# Patient Record
Sex: Female | Born: 1945
Health system: Southern US, Community
[De-identification: ages and names within clinical notes are randomized; demographics above are authoritative.]

## PROBLEM LIST (undated history)

## (undated) DIAGNOSIS — I10 Essential (primary) hypertension: Secondary | ICD-10-CM

## (undated) DIAGNOSIS — R51 Headache: Secondary | ICD-10-CM

## (undated) DIAGNOSIS — R519 Headache, unspecified: Secondary | ICD-10-CM

## (undated) DIAGNOSIS — J45909 Unspecified asthma, uncomplicated: Secondary | ICD-10-CM

## (undated) DIAGNOSIS — H269 Unspecified cataract: Secondary | ICD-10-CM

## (undated) HISTORY — PX: BREAST SURGERY: SHX581

---

## 1999-05-12 ENCOUNTER — Other Ambulatory Visit: Admission: RE | Admit: 1999-05-12 | Discharge: 1999-05-12 | Payer: Self-pay | Admitting: Obstetrics and Gynecology

## 2000-06-05 ENCOUNTER — Other Ambulatory Visit: Admission: RE | Admit: 2000-06-05 | Discharge: 2000-06-05 | Payer: Self-pay | Admitting: Obstetrics and Gynecology

## 2001-06-17 ENCOUNTER — Other Ambulatory Visit: Admission: RE | Admit: 2001-06-17 | Discharge: 2001-06-17 | Payer: Self-pay | Admitting: Obstetrics and Gynecology

## 2001-06-18 ENCOUNTER — Encounter: Payer: Self-pay | Admitting: Obstetrics and Gynecology

## 2001-06-18 ENCOUNTER — Ambulatory Visit (HOSPITAL_COMMUNITY): Admission: RE | Admit: 2001-06-18 | Discharge: 2001-06-18 | Payer: Self-pay | Admitting: Obstetrics and Gynecology

## 2002-07-09 ENCOUNTER — Other Ambulatory Visit: Admission: RE | Admit: 2002-07-09 | Discharge: 2002-07-09 | Payer: Self-pay | Admitting: Obstetrics and Gynecology

## 2003-01-13 ENCOUNTER — Emergency Department (HOSPITAL_COMMUNITY): Admission: AD | Admit: 2003-01-13 | Discharge: 2003-01-13 | Payer: Self-pay | Admitting: Family Medicine

## 2003-08-21 ENCOUNTER — Other Ambulatory Visit: Admission: RE | Admit: 2003-08-21 | Discharge: 2003-08-21 | Payer: Self-pay | Admitting: Obstetrics and Gynecology

## 2004-02-11 ENCOUNTER — Ambulatory Visit (HOSPITAL_COMMUNITY): Admission: RE | Admit: 2004-02-11 | Discharge: 2004-02-11 | Payer: Self-pay | Admitting: Gastroenterology

## 2004-08-23 ENCOUNTER — Other Ambulatory Visit: Admission: RE | Admit: 2004-08-23 | Discharge: 2004-08-23 | Payer: Self-pay | Admitting: Obstetrics and Gynecology

## 2005-09-19 ENCOUNTER — Encounter: Admission: RE | Admit: 2005-09-19 | Discharge: 2005-09-19 | Payer: Self-pay | Admitting: Internal Medicine

## 2006-12-15 ENCOUNTER — Emergency Department (HOSPITAL_COMMUNITY): Admission: EM | Admit: 2006-12-15 | Discharge: 2006-12-15 | Payer: Self-pay | Admitting: Emergency Medicine

## 2008-03-24 ENCOUNTER — Encounter (INDEPENDENT_AMBULATORY_CARE_PROVIDER_SITE_OTHER): Payer: Self-pay | Admitting: *Deleted

## 2009-02-16 ENCOUNTER — Telehealth: Payer: Self-pay | Admitting: Internal Medicine

## 2010-02-03 NOTE — Progress Notes (Signed)
Summary: Schedule Colonoscopy  Phone Note Outgoing Call Call back at [facility] Phone [phone]   Call placed by: Harlow Mares CMA Duncan Dull),  February 16, 2009 9:04 AM Call placed to: Patient Summary of Call: No Answer patient needs colonoscopy Initial call taken by: Harlow Mares CMA Duncan Dull),  February 16, 2009 9:04 AM  Follow-up for Phone Call        No Answer  Follow-up by: Harlow Mares CMA Duncan Dull),  February 24, 2009 12:34 PM

## 2010-05-20 NOTE — Op Note (Signed)
NAME:  Sandra Walton, Sandra Walton                ACCOUNT NO.:  [id number]   MEDICAL RECORD NO.:  [id number]          PATIENT TYPE:  AMB   LOCATION:  ENDO                         FACILITY:  [facility]   PHYSICIAN:  Danise Edge, M.D.   DATE OF BIRTH:  27-Sep-1945   DATE OF PROCEDURE:  02/11/2004  DATE OF DISCHARGE:                                 OPERATIVE REPORT   REFERRING PHYSICIAN:  Dr. Candyce Churn.   PROCEDURE:  Diagnostic colonoscopy.   PROCEDURE INDICATIONS:  Ms. Sandra Walton is a 65 year old female born  [birth year]. Ms. Sandra Walton is undergoing diagnostic colonoscopy to  evaluate abdominal cramps and hematochezia.   ENDOSCOPIST:  Danise Edge, M.D.   PREMEDICATION:  Versed 5 mg, Demerol 50 mg.   PROCEDURE:  After obtaining informed consent, Ms. Sandra Walton was placed in the  left lateral decubitus position. I administered intravenous Demerol and  intravenous Versed to achieve conscious sedation for the procedure. The  patient's blood pressure, oxygen saturation and cardiac rhythm were  monitored throughout the procedure and documented in the medical record.   Anal inspection and digital rectal exam were normal. The Olympus adjustable  pediatric colonoscope was introduced into the rectum and advanced to the  cecum. Colonic preparation for the exam today was excellent.   Rectum normal.   Sigmoid colon and descending colon normal.   Splenic flexure normal.   Transverse colon normal.   Hepatic flexure normal.   Ascending colon normal.   Cecum and ileocecal valve normal.   ASSESSMENT:  Normal diagnostic proctocolonoscopy to cecum.      MJ/MEDQ  D:  02/11/2004  T:  02/11/2004  Job:  [id number]   cc:   Candyce Churn, M.D.  [address] [place]  [place] [address]  Fax: [phone]

## 2011-02-03 DIAGNOSIS — M159 Polyosteoarthritis, unspecified: Secondary | ICD-10-CM | POA: Diagnosis not present

## 2011-04-10 DIAGNOSIS — R071 Chest pain on breathing: Secondary | ICD-10-CM | POA: Diagnosis not present

## 2011-04-20 DIAGNOSIS — Z79899 Other long term (current) drug therapy: Secondary | ICD-10-CM | POA: Diagnosis not present

## 2011-04-20 DIAGNOSIS — E782 Mixed hyperlipidemia: Secondary | ICD-10-CM | POA: Diagnosis not present

## 2011-04-20 DIAGNOSIS — I10 Essential (primary) hypertension: Secondary | ICD-10-CM | POA: Diagnosis not present

## 2011-04-20 DIAGNOSIS — Z0000 Encounter for general adult medical examination without abnormal findings: Secondary | ICD-10-CM | POA: Diagnosis not present

## 2011-04-20 DIAGNOSIS — E559 Vitamin D deficiency, unspecified: Secondary | ICD-10-CM | POA: Diagnosis not present

## 2011-04-20 DIAGNOSIS — R071 Chest pain on breathing: Secondary | ICD-10-CM | POA: Diagnosis not present

## 2011-04-20 DIAGNOSIS — Z23 Encounter for immunization: Secondary | ICD-10-CM | POA: Diagnosis not present

## 2011-04-20 DIAGNOSIS — R000 Tachycardia, unspecified: Secondary | ICD-10-CM | POA: Diagnosis not present

## 2011-07-18 ENCOUNTER — Encounter: Payer: Self-pay | Admitting: Internal Medicine

## 2011-07-21 DIAGNOSIS — H60399 Other infective otitis externa, unspecified ear: Secondary | ICD-10-CM | POA: Diagnosis not present

## 2011-07-25 DIAGNOSIS — H6980 Other specified disorders of Eustachian tube, unspecified ear: Secondary | ICD-10-CM | POA: Diagnosis not present

## 2011-07-25 DIAGNOSIS — Z0000 Encounter for general adult medical examination without abnormal findings: Secondary | ICD-10-CM | POA: Diagnosis not present

## 2011-07-25 DIAGNOSIS — H6690 Otitis media, unspecified, unspecified ear: Secondary | ICD-10-CM | POA: Diagnosis not present

## 2011-07-25 DIAGNOSIS — H60399 Other infective otitis externa, unspecified ear: Secondary | ICD-10-CM | POA: Diagnosis not present

## 2011-08-15 DIAGNOSIS — H60399 Other infective otitis externa, unspecified ear: Secondary | ICD-10-CM | POA: Diagnosis not present

## 2011-08-15 DIAGNOSIS — J301 Allergic rhinitis due to pollen: Secondary | ICD-10-CM | POA: Diagnosis not present

## 2012-04-26 DIAGNOSIS — G43109 Migraine with aura, not intractable, without status migrainosus: Secondary | ICD-10-CM | POA: Diagnosis not present

## 2012-04-26 DIAGNOSIS — J309 Allergic rhinitis, unspecified: Secondary | ICD-10-CM | POA: Diagnosis not present

## 2012-04-26 DIAGNOSIS — Z0000 Encounter for general adult medical examination without abnormal findings: Secondary | ICD-10-CM | POA: Diagnosis not present

## 2012-04-26 DIAGNOSIS — I10 Essential (primary) hypertension: Secondary | ICD-10-CM | POA: Diagnosis not present

## 2012-04-26 DIAGNOSIS — Z79899 Other long term (current) drug therapy: Secondary | ICD-10-CM | POA: Diagnosis not present

## 2012-04-26 DIAGNOSIS — E782 Mixed hyperlipidemia: Secondary | ICD-10-CM | POA: Diagnosis not present

## 2012-04-26 DIAGNOSIS — E559 Vitamin D deficiency, unspecified: Secondary | ICD-10-CM | POA: Diagnosis not present

## 2013-05-05 DIAGNOSIS — J309 Allergic rhinitis, unspecified: Secondary | ICD-10-CM | POA: Diagnosis not present

## 2013-05-12 DIAGNOSIS — Z1331 Encounter for screening for depression: Secondary | ICD-10-CM | POA: Diagnosis not present

## 2013-05-12 DIAGNOSIS — Z0000 Encounter for general adult medical examination without abnormal findings: Secondary | ICD-10-CM | POA: Diagnosis not present

## 2013-05-12 DIAGNOSIS — E782 Mixed hyperlipidemia: Secondary | ICD-10-CM | POA: Diagnosis not present

## 2013-05-12 DIAGNOSIS — G43109 Migraine with aura, not intractable, without status migrainosus: Secondary | ICD-10-CM | POA: Diagnosis not present

## 2013-05-12 DIAGNOSIS — J309 Allergic rhinitis, unspecified: Secondary | ICD-10-CM | POA: Diagnosis not present

## 2013-05-12 DIAGNOSIS — E559 Vitamin D deficiency, unspecified: Secondary | ICD-10-CM | POA: Diagnosis not present

## 2013-05-12 DIAGNOSIS — Z79899 Other long term (current) drug therapy: Secondary | ICD-10-CM | POA: Diagnosis not present

## 2013-05-12 DIAGNOSIS — I10 Essential (primary) hypertension: Secondary | ICD-10-CM | POA: Diagnosis not present

## 2013-05-20 DIAGNOSIS — N289 Disorder of kidney and ureter, unspecified: Secondary | ICD-10-CM | POA: Diagnosis not present

## 2013-07-18 DIAGNOSIS — Z124 Encounter for screening for malignant neoplasm of cervix: Secondary | ICD-10-CM | POA: Diagnosis not present

## 2013-07-18 DIAGNOSIS — Z01419 Encounter for gynecological examination (general) (routine) without abnormal findings: Secondary | ICD-10-CM | POA: Diagnosis not present

## 2013-07-18 DIAGNOSIS — Z1231 Encounter for screening mammogram for malignant neoplasm of breast: Secondary | ICD-10-CM | POA: Diagnosis not present

## 2013-08-26 DIAGNOSIS — H01009 Unspecified blepharitis unspecified eye, unspecified eyelid: Secondary | ICD-10-CM | POA: Diagnosis not present

## 2013-08-26 DIAGNOSIS — H40019 Open angle with borderline findings, low risk, unspecified eye: Secondary | ICD-10-CM | POA: Diagnosis not present

## 2013-08-26 DIAGNOSIS — H25019 Cortical age-related cataract, unspecified eye: Secondary | ICD-10-CM | POA: Diagnosis not present

## 2014-02-24 DIAGNOSIS — H2513 Age-related nuclear cataract, bilateral: Secondary | ICD-10-CM | POA: Diagnosis not present

## 2014-02-24 DIAGNOSIS — H40013 Open angle with borderline findings, low risk, bilateral: Secondary | ICD-10-CM | POA: Diagnosis not present

## 2014-03-26 DIAGNOSIS — T07 Unspecified multiple injuries: Secondary | ICD-10-CM | POA: Diagnosis not present

## 2014-07-09 DIAGNOSIS — H40013 Open angle with borderline findings, low risk, bilateral: Secondary | ICD-10-CM | POA: Diagnosis not present

## 2014-07-09 DIAGNOSIS — H25013 Cortical age-related cataract, bilateral: Secondary | ICD-10-CM | POA: Diagnosis not present

## 2014-07-28 DIAGNOSIS — Z6828 Body mass index (BMI) 28.0-28.9, adult: Secondary | ICD-10-CM | POA: Diagnosis not present

## 2014-07-28 DIAGNOSIS — R319 Hematuria, unspecified: Secondary | ICD-10-CM | POA: Diagnosis not present

## 2014-07-28 DIAGNOSIS — Z130 Encounter for screening for diseases of the blood and blood-forming organs and certain disorders involving the immune mechanism: Secondary | ICD-10-CM | POA: Diagnosis not present

## 2014-07-28 DIAGNOSIS — Z01419 Encounter for gynecological examination (general) (routine) without abnormal findings: Secondary | ICD-10-CM | POA: Diagnosis not present

## 2014-07-28 DIAGNOSIS — Z1231 Encounter for screening mammogram for malignant neoplasm of breast: Secondary | ICD-10-CM | POA: Diagnosis not present

## 2014-08-04 ENCOUNTER — Ambulatory Visit
Admission: RE | Admit: 2014-08-04 | Discharge: 2014-08-04 | Disposition: A | Discharge status: Home / Self Care | Payer: Medicare Other | Source: Ambulatory Visit | Attending: Nurse Practitioner | Admitting: Nurse Practitioner

## 2014-08-04 ENCOUNTER — Other Ambulatory Visit: Payer: Self-pay | Admitting: Nurse Practitioner

## 2014-08-04 DIAGNOSIS — R0781 Pleurodynia: Secondary | ICD-10-CM | POA: Diagnosis not present

## 2014-08-04 DIAGNOSIS — S79912A Unspecified injury of left hip, initial encounter: Secondary | ICD-10-CM | POA: Diagnosis not present

## 2014-08-04 DIAGNOSIS — M5489 Other dorsalgia: Secondary | ICD-10-CM

## 2014-08-04 DIAGNOSIS — S3992XA Unspecified injury of lower back, initial encounter: Secondary | ICD-10-CM | POA: Diagnosis not present

## 2014-08-04 DIAGNOSIS — S299XXA Unspecified injury of thorax, initial encounter: Secondary | ICD-10-CM | POA: Diagnosis not present

## 2014-08-04 DIAGNOSIS — M25551 Pain in right hip: Secondary | ICD-10-CM | POA: Diagnosis not present

## 2014-08-04 DIAGNOSIS — M549 Dorsalgia, unspecified: Secondary | ICD-10-CM | POA: Diagnosis not present

## 2014-08-04 DIAGNOSIS — W19XXXA Unspecified fall, initial encounter: Secondary | ICD-10-CM | POA: Diagnosis not present

## 2014-08-04 DIAGNOSIS — M25552 Pain in left hip: Secondary | ICD-10-CM | POA: Diagnosis not present

## 2014-08-25 DIAGNOSIS — M549 Dorsalgia, unspecified: Secondary | ICD-10-CM | POA: Diagnosis not present

## 2014-08-25 DIAGNOSIS — M25551 Pain in right hip: Secondary | ICD-10-CM | POA: Diagnosis not present

## 2014-08-25 DIAGNOSIS — W19XXXA Unspecified fall, initial encounter: Secondary | ICD-10-CM | POA: Diagnosis not present

## 2014-08-25 DIAGNOSIS — R319 Hematuria, unspecified: Secondary | ICD-10-CM | POA: Diagnosis not present

## 2014-10-16 DIAGNOSIS — Z0001 Encounter for general adult medical examination with abnormal findings: Secondary | ICD-10-CM | POA: Diagnosis not present

## 2014-10-16 DIAGNOSIS — E782 Mixed hyperlipidemia: Secondary | ICD-10-CM | POA: Diagnosis not present

## 2014-10-16 DIAGNOSIS — R319 Hematuria, unspecified: Secondary | ICD-10-CM | POA: Diagnosis not present

## 2014-10-16 DIAGNOSIS — I10 Essential (primary) hypertension: Secondary | ICD-10-CM | POA: Diagnosis not present

## 2014-10-16 DIAGNOSIS — Z79899 Other long term (current) drug therapy: Secondary | ICD-10-CM | POA: Diagnosis not present

## 2014-10-16 DIAGNOSIS — Z1389 Encounter for screening for other disorder: Secondary | ICD-10-CM | POA: Diagnosis not present

## 2014-10-16 DIAGNOSIS — Z23 Encounter for immunization: Secondary | ICD-10-CM | POA: Diagnosis not present

## 2014-10-16 DIAGNOSIS — J309 Allergic rhinitis, unspecified: Secondary | ICD-10-CM | POA: Diagnosis not present

## 2014-10-16 DIAGNOSIS — E559 Vitamin D deficiency, unspecified: Secondary | ICD-10-CM | POA: Diagnosis not present

## 2014-10-16 DIAGNOSIS — K219 Gastro-esophageal reflux disease without esophagitis: Secondary | ICD-10-CM | POA: Diagnosis not present

## 2014-10-16 DIAGNOSIS — G43109 Migraine with aura, not intractable, without status migrainosus: Secondary | ICD-10-CM | POA: Diagnosis not present

## 2014-10-16 DIAGNOSIS — Z7189 Other specified counseling: Secondary | ICD-10-CM | POA: Diagnosis not present

## 2014-10-20 DIAGNOSIS — Z0001 Encounter for general adult medical examination with abnormal findings: Secondary | ICD-10-CM | POA: Diagnosis not present

## 2014-10-20 DIAGNOSIS — Z79899 Other long term (current) drug therapy: Secondary | ICD-10-CM | POA: Diagnosis not present

## 2014-11-10 DIAGNOSIS — N390 Urinary tract infection, site not specified: Secondary | ICD-10-CM | POA: Diagnosis not present

## 2015-01-07 DIAGNOSIS — J329 Chronic sinusitis, unspecified: Secondary | ICD-10-CM | POA: Diagnosis not present

## 2015-04-09 ENCOUNTER — Other Ambulatory Visit: Payer: Self-pay | Admitting: Gastroenterology

## 2015-05-11 ENCOUNTER — Encounter (HOSPITAL_COMMUNITY): Payer: Self-pay | Admitting: *Deleted

## 2015-05-17 ENCOUNTER — Encounter (HOSPITAL_COMMUNITY): Payer: Self-pay | Admitting: Anesthesiology

## 2015-05-17 NOTE — Anesthesia Preprocedure Evaluation (Addendum)
Anesthesia Evaluation  Patient identified by MRN, date of birth, ID band Patient awake    Reviewed: Allergy & Precautions, NPO status , Patient's Chart, lab work & pertinent test results  Airway Mallampati: II  TM Distance: >3 FB Neck ROM: Full    Dental no notable dental hx.    Pulmonary asthma ,    Pulmonary exam normal breath sounds clear to auscultation       Cardiovascular hypertension, Normal cardiovascular exam Rhythm:Regular Rate:Normal     Neuro/Psych  Headaches, negative psych ROS   GI/Hepatic negative GI ROS, Neg liver ROS,   Endo/Other  negative endocrine ROS  Renal/GU negative Renal ROS  negative genitourinary   Musculoskeletal negative musculoskeletal ROS (+)   Abdominal   Peds negative pediatric ROS (+)  Hematology negative hematology ROS (+)   Anesthesia Other Findings   Reproductive/Obstetrics negative OB ROS                             Anesthesia Physical Anesthesia Plan  ASA: II  Anesthesia Plan: MAC   Post-op Pain Management:    Induction: Intravenous  Airway Management Planned: Natural Airway  Additional Equipment:   Intra-op Plan:   Post-operative Plan:   Informed Consent: I have reviewed the patients History and Physical, chart, labs and discussed the procedure including the risks, benefits and alternatives for the proposed anesthesia with the patient or authorized representative who has indicated his/her understanding and acceptance.   Dental advisory given  Plan Discussed with: CRNA  Anesthesia Plan Comments:         Anesthesia Quick Evaluation

## 2015-05-18 ENCOUNTER — Encounter (HOSPITAL_COMMUNITY)
Admission: RE | Disposition: A | Discharge status: Home / Self Care | Payer: Self-pay | Source: Ambulatory Visit | Attending: Gastroenterology

## 2015-05-18 ENCOUNTER — Encounter (HOSPITAL_COMMUNITY): Payer: Self-pay

## 2015-05-18 ENCOUNTER — Ambulatory Visit (HOSPITAL_COMMUNITY): Payer: Medicare Other | Admitting: Anesthesiology

## 2015-05-18 ENCOUNTER — Ambulatory Visit (HOSPITAL_COMMUNITY)
Admission: RE | Admit: 2015-05-18 | Discharge: 2015-05-18 | Disposition: A | Discharge status: Home / Self Care | Payer: Medicare Other | Source: Ambulatory Visit | Attending: Gastroenterology | Admitting: Gastroenterology

## 2015-05-18 DIAGNOSIS — Z1211 Encounter for screening for malignant neoplasm of colon: Secondary | ICD-10-CM | POA: Insufficient documentation

## 2015-05-18 DIAGNOSIS — E7800 Pure hypercholesterolemia, unspecified: Secondary | ICD-10-CM | POA: Diagnosis not present

## 2015-05-18 DIAGNOSIS — I10 Essential (primary) hypertension: Secondary | ICD-10-CM | POA: Insufficient documentation

## 2015-05-18 HISTORY — DX: Unspecified asthma, uncomplicated: J45.909

## 2015-05-18 HISTORY — PX: COLONOSCOPY WITH PROPOFOL: SHX5780

## 2015-05-18 HISTORY — DX: Headache, unspecified: R51.9

## 2015-05-18 HISTORY — DX: Essential (primary) hypertension: I10

## 2015-05-18 HISTORY — DX: Headache: R51

## 2015-05-18 HISTORY — DX: Unspecified cataract: H26.9

## 2015-05-18 SURGERY — COLONOSCOPY WITH PROPOFOL
Anesthesia: Monitor Anesthesia Care

## 2015-05-18 MED ORDER — LACTATED RINGERS IV SOLN
INTRAVENOUS | Status: DC
  Administered 2015-05-18: 1000 mL via INTRAVENOUS

## 2015-05-18 MED ORDER — PROPOFOL 10 MG/ML IV BOLUS
INTRAVENOUS | Status: AC
  Filled 2015-05-18: qty 40

## 2015-05-18 MED ORDER — LIDOCAINE HCL (CARDIAC) 20 MG/ML IV SOLN
INTRAVENOUS | Status: DC | PRN
  Administered 2015-05-18: 100 mg via INTRAVENOUS

## 2015-05-18 MED ORDER — PROPOFOL 500 MG/50ML IV EMUL
INTRAVENOUS | Status: DC | PRN
  Administered 2015-05-18: 140 ug/kg/min via INTRAVENOUS

## 2015-05-18 MED ORDER — SODIUM CHLORIDE 0.9 % IV SOLN: INTRAVENOUS | Status: DC

## 2015-05-18 MED ORDER — PROPOFOL 10 MG/ML IV BOLUS
INTRAVENOUS | Status: DC | PRN
  Administered 2015-05-18: 20 mg via INTRAVENOUS

## 2015-05-18 MED ORDER — LIDOCAINE HCL (CARDIAC) 20 MG/ML IV SOLN
INTRAVENOUS | Status: AC
  Filled 2015-05-18: qty 5

## 2015-05-18 SURGICAL SUPPLY — 22 items

## 2015-05-18 NOTE — Discharge Instructions (Signed)

## 2015-05-18 NOTE — H&P (Signed)
  Procedure: Screening colonoscopy. 02/11/2004 normal screening colonoscopy was performed  History: The patient is a 70 year old female born 11-05-1945. She is scheduled to undergo a screening colonoscopy today.  Past medical history: Hypertension. Hypercholesterolemia. Allergic rhinitis. Eczema. Migraine headache syndrome. Breast lumpectomy.  Allergies: Lipitor caused arthralgias. Bee venom  Exam: The patient is alert and lying comfortably on the endoscopy stretcher. Abdomen is soft and nontender to palpation. Lungs are clear to auscultation. Cardiac exam reveals a regular rhythm.  Plan: Proceed with screening colonoscopy

## 2015-05-18 NOTE — Anesthesia Postprocedure Evaluation (Signed)
Anesthesia Post Note  Patient: Sandra Walton  Procedure(s) Performed: Procedure(s) (LRB): COLONOSCOPY WITH PROPOFOL (N/A)  Patient location during evaluation: PACU Anesthesia Type: MAC Level of consciousness: awake and alert Pain management: pain level controlled Vital Signs Assessment: post-procedure vital signs reviewed and stable Respiratory status: spontaneous breathing, nonlabored ventilation, respiratory function stable and patient connected to nasal cannula oxygen Cardiovascular status: stable and blood pressure returned to baseline Anesthetic complications: no    Last Vitals:  Filed Vitals:   05/18/15 0820 05/18/15 0830  BP: 148/75 131/65  Pulse: 73 72  Temp:    Resp: 14 14    Last Pain: There were no vitals filed for this visit.               Ashe Gago J

## 2015-05-18 NOTE — Transfer of Care (Signed)
Immediate Anesthesia Transfer of Care Note  Patient: Sandra Walton  Procedure(s) Performed: Procedure(s): COLONOSCOPY WITH PROPOFOL (N/A)  Patient Location: Endoscopy Unit  Anesthesia Type:MAC  Level of Consciousness: awake and alert   Airway & Oxygen Therapy: Patient Spontanous Breathing and Patient connected to face mask oxygen  Post-op Assessment: Report given to RN and Post -op Vital signs reviewed and stable  Post vital signs: Reviewed and stable  Last Vitals:  Filed Vitals:   05/18/15 0638  BP: 129/59  Pulse: 73  Temp: 36.4 C  Resp: 15    Last Pain: There were no vitals filed for this visit.       Complications: No apparent anesthesia complications

## 2015-05-18 NOTE — Op Note (Signed)
[facility] Patient Name: Sandra Walton Procedure Date: 05/18/2015 MRN: [id number] Attending MD: Garlan Fair , MD Date of Birth: August 28, 1945 CSN: [id number] Age: 70 Admit Type: Outpatient Procedure:                Colonoscopy Indications:              Screening for colorectal malignant neoplasm Providers:                Garlan Fair, MD, William Dalton, Technician,                            Hilma Favors, RN, Zenon Mayo, RN Referring MD:              Medicines:                Propofol per Anesthesia Complications:            No immediate complications. Estimated Blood Loss:     Estimated blood loss: none. Procedure:                Pre-Anesthesia Assessment:                           - Prior to the procedure, a History and Physical                            was performed, and patient medications and                            allergies were reviewed. The patient's tolerance of                            previous anesthesia was also reviewed. The risks                            and benefits of the procedure and the sedation                            options and risks were discussed with the patient.                            All questions were answered, and informed consent                            was obtained. Prior Anticoagulants: The patient has                            taken aspirin, last dose was 1 day prior to                            procedure. ASA Grade Assessment: II - A patient                            with mild systemic disease. After reviewing the  risks and benefits, the patient was deemed in                            satisfactory condition to undergo the procedure.                           After obtaining informed consent, the colonoscope                            was passed under direct vision. Throughout the                            procedure, the patient's blood pressure, pulse, and              oxygen saturations were monitored continuously. The                            [id number] scope was introduced through                            the anus and advanced to the the cecum, identified                            by appendiceal orifice and ileocecal valve. The                            colonoscopy was performed without difficulty. The                            patient tolerated the procedure well. The quality                            of the bowel preparation was good. The appendiceal                            orifice and the rectum were photographed. Scope In: 7:46:10 AM Scope Out: 8:01:51 AM Scope Withdrawal Time: 0 hours 7 minutes 51 seconds  Total Procedure Duration: 0 hours 15 minutes 41 seconds  Findings:      The perianal and digital rectal examinations were normal.      The entire examined colon appeared normal. Impression:               - The entire examined colon is normal.                           - No specimens collected. Moderate Sedation:      N/A- Per Anesthesia Care Recommendation:           - Patient has a contact number available for                            emergencies. The signs and symptoms of potential                            delayed complications were discussed with the  patient. Return to normal activities tomorrow.                            Written discharge instructions were provided to the                            patient.                           - Repeat colonoscopy is not recommended for                            screening purposes.                           - Resume previous diet.                           - Continue present medications. Procedure Code(s):        --- Professional ---                           [id number], Colonoscopy, flexible; diagnostic, including                            collection of specimen(s) by brushing or washing,                            when performed  (separate procedure) Diagnosis Code(s):        --- Professional ---                           Z12.11, Encounter for screening for malignant                            neoplasm of colon CPT copyright 2016 American Medical Association. All rights reserved. The codes documented in this report are preliminary and upon coder review may  be revised to meet current compliance requirements. Earle Gell, MD Garlan Fair, MD 05/18/2015 8:05:53 AM This report has been signed electronically. Number of Addenda: 0

## 2015-05-19 ENCOUNTER — Encounter (HOSPITAL_COMMUNITY): Payer: Self-pay | Admitting: Gastroenterology

## 2015-09-02 DIAGNOSIS — J3489 Other specified disorders of nose and nasal sinuses: Secondary | ICD-10-CM | POA: Diagnosis not present

## 2015-10-19 DIAGNOSIS — Z1389 Encounter for screening for other disorder: Secondary | ICD-10-CM | POA: Diagnosis not present

## 2015-10-19 DIAGNOSIS — E782 Mixed hyperlipidemia: Secondary | ICD-10-CM | POA: Diagnosis not present

## 2015-10-19 DIAGNOSIS — Z23 Encounter for immunization: Secondary | ICD-10-CM | POA: Diagnosis not present

## 2015-10-19 DIAGNOSIS — G43109 Migraine with aura, not intractable, without status migrainosus: Secondary | ICD-10-CM | POA: Diagnosis not present

## 2015-10-19 DIAGNOSIS — Z79899 Other long term (current) drug therapy: Secondary | ICD-10-CM | POA: Diagnosis not present

## 2015-10-19 DIAGNOSIS — K219 Gastro-esophageal reflux disease without esophagitis: Secondary | ICD-10-CM | POA: Diagnosis not present

## 2015-10-19 DIAGNOSIS — E559 Vitamin D deficiency, unspecified: Secondary | ICD-10-CM | POA: Diagnosis not present

## 2015-10-19 DIAGNOSIS — R000 Tachycardia, unspecified: Secondary | ICD-10-CM | POA: Diagnosis not present

## 2015-10-19 DIAGNOSIS — Z0001 Encounter for general adult medical examination with abnormal findings: Secondary | ICD-10-CM | POA: Diagnosis not present

## 2015-10-19 DIAGNOSIS — I10 Essential (primary) hypertension: Secondary | ICD-10-CM | POA: Diagnosis not present

## 2016-02-14 DIAGNOSIS — L245 Irritant contact dermatitis due to other chemical products: Secondary | ICD-10-CM | POA: Diagnosis not present

## 2016-05-25 DIAGNOSIS — J301 Allergic rhinitis due to pollen: Secondary | ICD-10-CM | POA: Diagnosis not present

## 2016-06-08 DIAGNOSIS — J019 Acute sinusitis, unspecified: Secondary | ICD-10-CM | POA: Diagnosis not present

## 2016-06-11 ENCOUNTER — Ambulatory Visit (HOSPITAL_COMMUNITY)
Admission: EM | Admit: 2016-06-11 | Discharge: 2016-06-11 | Disposition: A | Discharge status: Home / Self Care | Payer: Medicare Other | Attending: Internal Medicine | Admitting: Internal Medicine

## 2016-06-11 ENCOUNTER — Encounter (HOSPITAL_COMMUNITY): Payer: Self-pay | Admitting: *Deleted

## 2016-06-11 DIAGNOSIS — R197 Diarrhea, unspecified: Secondary | ICD-10-CM | POA: Diagnosis not present

## 2016-06-11 DIAGNOSIS — J321 Chronic frontal sinusitis: Secondary | ICD-10-CM

## 2016-06-11 MED ORDER — AZITHROMYCIN 250 MG PO TABS: 250.0000 mg | ORAL_TABLET | Freq: Every day | ORAL | 0 refills | Status: DC

## 2016-06-11 MED ORDER — PREDNISONE 10 MG (21) PO TBPK: ORAL_TABLET | Freq: Every day | ORAL | 0 refills | Status: DC

## 2016-06-11 NOTE — Discharge Instructions (Signed)
Use humidifier at night to help Take motrin or tylenol as needed Eat prior to taking abx Take the full dose of abx Continue taking Flonase

## 2016-06-11 NOTE — ED Triage Notes (Signed)
Pt   Reports      Cough   And  Congestion            X   sev x  3   Weeks  Pt  Was   Seen  By  Her  Family  Dr  3 days       Ago   For  Sinus       Infection      And     Was  rx  Anti  Biotics    That  Gave her   Diarrhea       And   She  Stopped  Taking      Them

## 2016-06-11 NOTE — ED Provider Notes (Signed)
CSN: [id number]     Arrival date & time 06/11/16  1303 History   First MD Initiated Contact with Patient 06/11/16 1450     Chief Complaint  Patient presents with  . Cough   (Consider location/radiation/quality/duration/timing/severity/associated sxs/prior Treatment) Pt has had congestion, runny nose, for 10 days. Seen her pcp was places on Flonase and amoxicillin with no relief and states that it cause loose stools x3 for 2 days and she stopped. Pt states that it is getting worse and causing pressure. Has not taken anything pta. Denies and abd pain, no n/v       Past Medical History:  Diagnosis Date  . Asthma    Asthma -child  . Cataracts, bilateral    not surgical yet  . Headache    past hx migraines  . Hypertension    Past Surgical History:  Procedure Laterality Date  . BREAST SURGERY Right    lumpectomy- benign  . COLONOSCOPY WITH PROPOFOL N/A 05/18/2015   Procedure: COLONOSCOPY WITH PROPOFOL;  Surgeon: Garlan Fair, MD;  Location: [facility];  Service: Endoscopy;  Laterality: N/A;   History reviewed. No pertinent family history. Social History  Substance Use Topics  . Smoking status: Never Smoker  . Smokeless tobacco: Never Used  . Alcohol use Yes     Comment: rare social   OB History    No data available     Review of Systems  Constitutional: Positive for appetite change.  HENT: Positive for congestion, ear pain, postnasal drip, rhinorrhea, sinus pain, sinus pressure and sneezing.   Eyes: Negative.   Respiratory: Negative.   Cardiovascular: Negative.   Gastrointestinal: Positive for diarrhea and nausea.  Genitourinary: Negative.   Skin: Negative.   Neurological: Negative.     Allergies  Bee venom  Home Medications   Prior to Admission medications   Medication Sig Start Date End Date Taking? Authorizing Provider  aspirin 81 MG chewable tablet Chew 81 mg by mouth daily.    [provider]  azithromycin (ZITHROMAX) 250 MG tablet Take 1  tablet (250 mg total) by mouth daily. Take first 2 tablets together, then 1 every day until finished. 06/11/16   Morley Kos A, NP  nebivolol (BYSTOLIC) 10 MG tablet Take 5 mg by mouth daily.    [provider]  predniSONE (STERAPRED UNI-PAK 21 TAB) 10 MG (21) TBPK tablet Take by mouth daily. Take 6 tabs by mouth daily  for 2 days, then 5 tabs for 2 days, then 4 tabs for 2 days, then 3 tabs for 2 days, 2 tabs for 2 days, then 1 tab by mouth daily for 2 days 06/11/16   Morley Kos A, NP  simvastatin (ZOCOR) 20 MG tablet Take 20 mg by mouth daily.    [provider]  valsartan (DIOVAN) 40 MG tablet Take 20 mg by mouth daily.    [provider]   Meds Ordered and Administered this Visit  Medications - No data to display  BP 122/70 (BP Location: Left Arm)   Pulse 82   Temp 98.7 F (37.1 C) (Oral)   Resp 16   SpO2 97%  No data found.   Physical Exam  Constitutional: She appears well-developed.  HENT:  Right Ear: External ear normal.  Left Ear: External ear normal.  Green thick post nasal drip, tenderness to rt side of the face in nasal area,   Eyes: Pupils are equal, round, and reactive to light.  Neck: Normal range of motion.  Cardiovascular:  Normal rate and regular rhythm.   Pulmonary/Chest: Effort normal and breath sounds normal.  Abdominal: Soft. Bowel sounds are normal.  Musculoskeletal: Normal range of motion.  Neurological: She is alert.  Skin: Skin is warm.    Urgent Care Course     Procedures (including critical care time)  Labs Review Labs Reviewed - No data to display  Imaging Review No results found.           MDM   1. Chronic frontal sinusitis   2. Diarrhea, unspecified type    Use humidifier at night to help Take motrin or tylenol as needed Eat prior to taking abx Take the full dose of abx Continue taking Charlyne Petrin, NP 06/11/16 1514

## 2016-06-15 DIAGNOSIS — J309 Allergic rhinitis, unspecified: Secondary | ICD-10-CM | POA: Diagnosis not present

## 2016-10-24 DIAGNOSIS — Z23 Encounter for immunization: Secondary | ICD-10-CM | POA: Diagnosis not present

## 2016-10-24 DIAGNOSIS — K219 Gastro-esophageal reflux disease without esophagitis: Secondary | ICD-10-CM | POA: Diagnosis not present

## 2016-10-24 DIAGNOSIS — E559 Vitamin D deficiency, unspecified: Secondary | ICD-10-CM | POA: Diagnosis not present

## 2016-10-24 DIAGNOSIS — E782 Mixed hyperlipidemia: Secondary | ICD-10-CM | POA: Diagnosis not present

## 2016-10-24 DIAGNOSIS — I10 Essential (primary) hypertension: Secondary | ICD-10-CM | POA: Diagnosis not present

## 2016-10-24 DIAGNOSIS — Z1389 Encounter for screening for other disorder: Secondary | ICD-10-CM | POA: Diagnosis not present

## 2016-10-24 DIAGNOSIS — Z79899 Other long term (current) drug therapy: Secondary | ICD-10-CM | POA: Diagnosis not present

## 2016-10-24 DIAGNOSIS — J019 Acute sinusitis, unspecified: Secondary | ICD-10-CM | POA: Diagnosis not present

## 2016-10-24 DIAGNOSIS — G43109 Migraine with aura, not intractable, without status migrainosus: Secondary | ICD-10-CM | POA: Diagnosis not present

## 2016-10-24 DIAGNOSIS — Z0001 Encounter for general adult medical examination with abnormal findings: Secondary | ICD-10-CM | POA: Diagnosis not present

## 2016-10-24 DIAGNOSIS — J309 Allergic rhinitis, unspecified: Secondary | ICD-10-CM | POA: Diagnosis not present

## 2016-10-28 DIAGNOSIS — E782 Mixed hyperlipidemia: Secondary | ICD-10-CM | POA: Diagnosis not present

## 2016-10-28 DIAGNOSIS — I10 Essential (primary) hypertension: Secondary | ICD-10-CM | POA: Diagnosis not present

## 2017-01-30 DIAGNOSIS — M109 Gout, unspecified: Secondary | ICD-10-CM | POA: Diagnosis not present

## 2017-02-06 DIAGNOSIS — L720 Epidermal cyst: Secondary | ICD-10-CM | POA: Diagnosis not present

## 2017-02-06 DIAGNOSIS — L723 Sebaceous cyst: Secondary | ICD-10-CM | POA: Diagnosis not present

## 2017-04-24 DIAGNOSIS — H66003 Acute suppurative otitis media without spontaneous rupture of ear drum, bilateral: Secondary | ICD-10-CM | POA: Diagnosis not present

## 2017-08-31 DIAGNOSIS — J309 Allergic rhinitis, unspecified: Secondary | ICD-10-CM | POA: Diagnosis not present

## 2017-12-11 DIAGNOSIS — E782 Mixed hyperlipidemia: Secondary | ICD-10-CM | POA: Diagnosis not present

## 2017-12-11 DIAGNOSIS — Z0001 Encounter for general adult medical examination with abnormal findings: Secondary | ICD-10-CM | POA: Diagnosis not present

## 2017-12-11 DIAGNOSIS — Z23 Encounter for immunization: Secondary | ICD-10-CM | POA: Diagnosis not present

## 2017-12-11 DIAGNOSIS — Z79899 Other long term (current) drug therapy: Secondary | ICD-10-CM | POA: Diagnosis not present

## 2017-12-11 DIAGNOSIS — R319 Hematuria, unspecified: Secondary | ICD-10-CM | POA: Diagnosis not present

## 2017-12-11 DIAGNOSIS — G43109 Migraine with aura, not intractable, without status migrainosus: Secondary | ICD-10-CM | POA: Diagnosis not present

## 2017-12-11 DIAGNOSIS — K219 Gastro-esophageal reflux disease without esophagitis: Secondary | ICD-10-CM | POA: Diagnosis not present

## 2017-12-11 DIAGNOSIS — Z1389 Encounter for screening for other disorder: Secondary | ICD-10-CM | POA: Diagnosis not present

## 2017-12-11 DIAGNOSIS — M109 Gout, unspecified: Secondary | ICD-10-CM | POA: Diagnosis not present

## 2017-12-11 DIAGNOSIS — E559 Vitamin D deficiency, unspecified: Secondary | ICD-10-CM | POA: Diagnosis not present

## 2017-12-11 DIAGNOSIS — I10 Essential (primary) hypertension: Secondary | ICD-10-CM | POA: Diagnosis not present

## 2017-12-13 DIAGNOSIS — B029 Zoster without complications: Secondary | ICD-10-CM | POA: Diagnosis not present

## 2018-03-26 DIAGNOSIS — B349 Viral infection, unspecified: Secondary | ICD-10-CM | POA: Diagnosis not present

## 2018-03-26 DIAGNOSIS — R55 Syncope and collapse: Secondary | ICD-10-CM | POA: Diagnosis not present

## 2018-10-24 DIAGNOSIS — Z23 Encounter for immunization: Secondary | ICD-10-CM | POA: Diagnosis not present

## 2019-08-29 ENCOUNTER — Other Ambulatory Visit: Payer: Self-pay

## 2019-08-29 DIAGNOSIS — Z20822 Contact with and (suspected) exposure to covid-19: Secondary | ICD-10-CM

## 2019-08-31 LAB — NOVEL CORONAVIRUS, NAA: SARS-CoV-2, NAA: NOT DETECTED

## 2019-08-31 LAB — SARS-COV-2, NAA 2 DAY TAT

## 2019-10-14 ENCOUNTER — Ambulatory Visit: Payer: Medicare Other | Attending: Critical Care Medicine

## 2019-10-14 DIAGNOSIS — Z23 Encounter for immunization: Secondary | ICD-10-CM

## 2019-10-14 NOTE — Progress Notes (Signed)
° °  Covid-19 Vaccination Clinic  Name:  Sandra Walton    MRN: [id number] DOB: Sep 15, 1945  10/14/2019  [name] was observed post Covid-19 immunization for 15 minutes without incident. She was provided with Vaccine Information Sheet and instruction to access the V-Safe system.   [name] was instructed to call 911 with any severe reactions post vaccine:  Difficulty breathing   Swelling of face and throat   A fast heartbeat   A bad rash all over body   Dizziness and weakness

## 2020-01-01 ENCOUNTER — Other Ambulatory Visit: Payer: Self-pay | Admitting: Internal Medicine

## 2020-01-01 DIAGNOSIS — Z1231 Encounter for screening mammogram for malignant neoplasm of breast: Secondary | ICD-10-CM

## 2020-01-01 DIAGNOSIS — Z13820 Encounter for screening for osteoporosis: Secondary | ICD-10-CM

## 2020-04-08 DIAGNOSIS — H40013 Open angle with borderline findings, low risk, bilateral: Secondary | ICD-10-CM | POA: Diagnosis not present

## 2020-04-19 ENCOUNTER — Ambulatory Visit: Payer: Medicare Other

## 2020-04-19 ENCOUNTER — Other Ambulatory Visit: Payer: Medicare Other

## 2020-04-20 ENCOUNTER — Inpatient Hospital Stay: Admission: RE | Admit: 2020-04-20 | Payer: Medicare Other | Source: Ambulatory Visit

## 2020-04-20 ENCOUNTER — Other Ambulatory Visit: Payer: Medicare Other

## 2020-05-10 ENCOUNTER — Ambulatory Visit (HOSPITAL_COMMUNITY)
Admission: EM | Admit: 2020-05-10 | Discharge: 2020-05-10 | Disposition: A | Discharge status: Home / Self Care | Payer: Medicare Other | Attending: Internal Medicine | Admitting: Internal Medicine

## 2020-05-10 ENCOUNTER — Encounter (HOSPITAL_COMMUNITY): Payer: Self-pay

## 2020-05-10 ENCOUNTER — Other Ambulatory Visit: Payer: Self-pay

## 2020-05-10 DIAGNOSIS — Z20822 Contact with and (suspected) exposure to covid-19: Secondary | ICD-10-CM

## 2020-05-10 NOTE — ED Triage Notes (Signed)
Pt needs covid test due to friend's great granddaughter tested positive for covid today. Pt denies any symptoms.

## 2020-05-10 NOTE — Discharge Instructions (Addendum)
Please monitor for symptoms If you develop symptoms -please come to the urgent care to be re-evaluated if the symptoms are severe.

## 2020-05-11 LAB — SARS CORONAVIRUS 2 (TAT 6-24 HRS): SARS Coronavirus 2: NEGATIVE

## 2020-05-12 NOTE — ED Provider Notes (Signed)
[facility]    CSN: [id number] Arrival date & time: 05/10/20  1715      History   Chief Complaint Chief Complaint  Patient presents with  . Covid Exposure    HPI Sandra Walton is a 75 y.o. female comes to the urgent care for COVID testing after she was exposed to a COVID-19 positive individual.  Patient has no symptoms.  Exposure was yesterday.  Patient is fully vaccinated and boosted against COVID-19 virus.Marland Kitchen   HPI  Past Medical History:  Diagnosis Date  . Asthma    Asthma -child  . Cataracts, bilateral    not surgical yet  . Headache    past hx migraines  . Hypertension     There are no problems to display for this patient.   Past Surgical History:  Procedure Laterality Date  . BREAST SURGERY Right    lumpectomy- benign  . COLONOSCOPY WITH PROPOFOL N/A 05/18/2015   Procedure: COLONOSCOPY WITH PROPOFOL;  Surgeon: Garlan Fair, MD;  Location: [facility];  Service: Endoscopy;  Laterality: N/A;    OB History   No obstetric history on file.      Home Medications    Prior to Admission medications   Medication Sig Start Date End Date Taking? Authorizing Provider  aspirin 81 MG chewable tablet Chew 81 mg by mouth daily.   Yes [provider]  nebivolol (BYSTOLIC) 10 MG tablet Take 5 mg by mouth daily.   Yes [provider]  simvastatin (ZOCOR) 20 MG tablet Take 20 mg by mouth daily.   Yes [provider]  valsartan (DIOVAN) 40 MG tablet Take 20 mg by mouth daily.   Yes [provider]    Family History Family History  Problem Relation Age of Onset  . Cancer Sister   . Heart disease Sister   . Cancer Brother     Social History Social History   Tobacco Use  . Smoking status: Never Smoker  . Smokeless tobacco: Never Used  Substance Use Topics  . Alcohol use: Yes    Comment: rare social  . Drug use: No     Allergies   Bee venom and Peanut-containing drug products   Review of Systems Review of  Systems  All other systems reviewed and are negative.    Physical Exam Triage Vital Signs ED Triage Vitals  Enc Vitals Group     BP 05/10/20 1843 (!) 144/89     Pulse Rate 05/10/20 1843 85     Resp 05/10/20 1843 18     Temp 05/10/20 1843 98.1 F (36.7 C)     Temp src --      SpO2 05/10/20 1843 98 %     Weight --      Height --      Head Circumference --      Peak Flow --      Pain Score 05/10/20 1840 0     Pain Loc --      Pain Edu? --      Excl. in [place]? --    No data found.  Updated Vital Signs BP (!) 144/89   Pulse 85   Temp 98.1 F (36.7 C)   Resp 18   SpO2 98%   Visual Acuity Right Eye Distance:   Left Eye Distance:   Bilateral Distance:    Right Eye Near:   Left Eye Near:    Bilateral Near:     Physical Exam Constitutional:  General: She is not in acute distress.    Appearance: Normal appearance. She is not diaphoretic.  Cardiovascular:     Rate and Rhythm: Normal rate and regular rhythm.  Pulmonary:     Effort: Pulmonary effort is normal.     Breath sounds: Normal breath sounds.  Musculoskeletal:        General: No swelling or signs of injury. Normal range of motion.  Skin:    General: Skin is warm.  Neurological:     Mental Status: She is alert.      UC Treatments / Results  Labs (all labs ordered are listed, but only abnormal results are displayed) Labs Reviewed  SARS CORONAVIRUS 2 (TAT 6-24 HRS)    EKG   Radiology No results found.  Procedures Procedures (including critical care time)  Medications Ordered in UC Medications - No data to display  Initial Impression / Assessment and Plan / UC Course  I have reviewed the triage vital signs and the nursing notes.  Pertinent labs & imaging results that were available during my care of the patient were reviewed by me and considered in my medical decision making (see chart for details).    1.  Close exposure to COVID-19 virus: COVID-19 PCR test has been sent Return to  urgent care if you develop symptoms We will call you with recommendations if your labs are abnormal. Please quarantine until COVID-19 test results are available. Final Clinical Impressions(s) / UC Diagnoses   Final diagnoses:  Close exposure to COVID-19 virus     Discharge Instructions     Please monitor for symptoms If you develop symptoms -please come to the urgent care to be re-evaluated if the symptoms are severe.   ED Prescriptions    None     PDMP not reviewed this encounter.   Chase Picket, MD 05/12/20 1659

## 2020-06-23 ENCOUNTER — Ambulatory Visit: Payer: Medicare Other

## 2020-06-29 ENCOUNTER — Ambulatory Visit
Admission: RE | Admit: 2020-06-29 | Discharge: 2020-06-29 | Disposition: A | Discharge status: Home / Self Care | Payer: Medicare Other | Source: Ambulatory Visit | Attending: Internal Medicine | Admitting: Internal Medicine

## 2020-06-29 ENCOUNTER — Other Ambulatory Visit: Payer: Self-pay

## 2020-06-29 DIAGNOSIS — Z1231 Encounter for screening mammogram for malignant neoplasm of breast: Secondary | ICD-10-CM

## 2020-07-02 ENCOUNTER — Other Ambulatory Visit: Payer: Self-pay | Admitting: Internal Medicine

## 2020-07-02 DIAGNOSIS — R928 Other abnormal and inconclusive findings on diagnostic imaging of breast: Secondary | ICD-10-CM

## 2020-07-28 ENCOUNTER — Ambulatory Visit
Admission: RE | Admit: 2020-07-28 | Discharge: 2020-07-28 | Disposition: A | Discharge status: Home / Self Care | Payer: Medicare Other | Source: Ambulatory Visit | Attending: Internal Medicine | Admitting: Internal Medicine

## 2020-07-28 ENCOUNTER — Other Ambulatory Visit: Payer: Self-pay | Admitting: Internal Medicine

## 2020-07-28 ENCOUNTER — Other Ambulatory Visit: Payer: Self-pay

## 2020-07-28 DIAGNOSIS — R921 Mammographic calcification found on diagnostic imaging of breast: Secondary | ICD-10-CM | POA: Diagnosis not present

## 2020-07-28 DIAGNOSIS — R928 Other abnormal and inconclusive findings on diagnostic imaging of breast: Secondary | ICD-10-CM

## 2020-07-28 DIAGNOSIS — R922 Inconclusive mammogram: Secondary | ICD-10-CM | POA: Diagnosis not present

## 2020-08-09 ENCOUNTER — Ambulatory Visit
Admission: RE | Admit: 2020-08-09 | Discharge: 2020-08-09 | Disposition: A | Discharge status: Home / Self Care | Payer: Medicare Other | Source: Ambulatory Visit | Attending: Internal Medicine | Admitting: Internal Medicine

## 2020-08-09 ENCOUNTER — Other Ambulatory Visit: Payer: Self-pay

## 2020-08-09 DIAGNOSIS — R928 Other abnormal and inconclusive findings on diagnostic imaging of breast: Secondary | ICD-10-CM

## 2020-08-09 DIAGNOSIS — R921 Mammographic calcification found on diagnostic imaging of breast: Secondary | ICD-10-CM | POA: Diagnosis not present

## 2020-08-09 DIAGNOSIS — N6489 Other specified disorders of breast: Secondary | ICD-10-CM | POA: Diagnosis not present

## 2020-11-01 DIAGNOSIS — Z23 Encounter for immunization: Secondary | ICD-10-CM | POA: Diagnosis not present

## 2020-11-01 DIAGNOSIS — M549 Dorsalgia, unspecified: Secondary | ICD-10-CM | POA: Diagnosis not present

## 2021-01-25 DIAGNOSIS — E782 Mixed hyperlipidemia: Secondary | ICD-10-CM | POA: Diagnosis not present

## 2021-01-25 DIAGNOSIS — E559 Vitamin D deficiency, unspecified: Secondary | ICD-10-CM | POA: Diagnosis not present

## 2021-01-25 DIAGNOSIS — I10 Essential (primary) hypertension: Secondary | ICD-10-CM | POA: Diagnosis not present

## 2021-01-25 DIAGNOSIS — Z1389 Encounter for screening for other disorder: Secondary | ICD-10-CM | POA: Diagnosis not present

## 2021-01-25 DIAGNOSIS — Z1239 Encounter for other screening for malignant neoplasm of breast: Secondary | ICD-10-CM | POA: Diagnosis not present

## 2021-01-25 DIAGNOSIS — Z0000 Encounter for general adult medical examination without abnormal findings: Secondary | ICD-10-CM | POA: Diagnosis not present

## 2021-01-25 DIAGNOSIS — Z79899 Other long term (current) drug therapy: Secondary | ICD-10-CM | POA: Diagnosis not present

## 2021-01-25 DIAGNOSIS — M549 Dorsalgia, unspecified: Secondary | ICD-10-CM | POA: Diagnosis not present

## 2021-01-25 DIAGNOSIS — M109 Gout, unspecified: Secondary | ICD-10-CM | POA: Diagnosis not present

## 2021-01-25 DIAGNOSIS — Z13820 Encounter for screening for osteoporosis: Secondary | ICD-10-CM | POA: Diagnosis not present

## 2021-01-25 DIAGNOSIS — Z1211 Encounter for screening for malignant neoplasm of colon: Secondary | ICD-10-CM | POA: Diagnosis not present

## 2021-01-25 DIAGNOSIS — K219 Gastro-esophageal reflux disease without esophagitis: Secondary | ICD-10-CM | POA: Diagnosis not present

## 2021-01-25 DIAGNOSIS — J309 Allergic rhinitis, unspecified: Secondary | ICD-10-CM | POA: Diagnosis not present

## 2021-01-26 ENCOUNTER — Other Ambulatory Visit: Payer: Self-pay | Admitting: Internal Medicine

## 2021-01-26 DIAGNOSIS — R921 Mammographic calcification found on diagnostic imaging of breast: Secondary | ICD-10-CM

## 2021-01-26 DIAGNOSIS — M810 Age-related osteoporosis without current pathological fracture: Secondary | ICD-10-CM

## 2021-02-01 DIAGNOSIS — M109 Gout, unspecified: Secondary | ICD-10-CM | POA: Diagnosis not present

## 2021-02-01 DIAGNOSIS — I10 Essential (primary) hypertension: Secondary | ICD-10-CM | POA: Diagnosis not present

## 2021-02-01 DIAGNOSIS — R7989 Other specified abnormal findings of blood chemistry: Secondary | ICD-10-CM | POA: Diagnosis not present

## 2021-03-01 ENCOUNTER — Other Ambulatory Visit: Payer: Medicare Other

## 2021-03-14 DIAGNOSIS — M109 Gout, unspecified: Secondary | ICD-10-CM | POA: Diagnosis not present

## 2021-03-14 DIAGNOSIS — I10 Essential (primary) hypertension: Secondary | ICD-10-CM | POA: Diagnosis not present

## 2021-03-14 DIAGNOSIS — R3129 Other microscopic hematuria: Secondary | ICD-10-CM | POA: Diagnosis not present

## 2021-03-14 DIAGNOSIS — N390 Urinary tract infection, site not specified: Secondary | ICD-10-CM | POA: Diagnosis not present

## 2021-03-14 DIAGNOSIS — R7989 Other specified abnormal findings of blood chemistry: Secondary | ICD-10-CM | POA: Diagnosis not present

## 2021-03-29 ENCOUNTER — Ambulatory Visit
Admission: RE | Admit: 2021-03-29 | Discharge: 2021-03-29 | Disposition: A | Discharge status: Home / Self Care | Payer: Medicare Other | Source: Ambulatory Visit | Attending: Internal Medicine | Admitting: Internal Medicine

## 2021-03-29 DIAGNOSIS — R922 Inconclusive mammogram: Secondary | ICD-10-CM | POA: Diagnosis not present

## 2021-03-29 DIAGNOSIS — R921 Mammographic calcification found on diagnostic imaging of breast: Secondary | ICD-10-CM

## 2021-03-29 DIAGNOSIS — N6315 Unspecified lump in the right breast, overlapping quadrants: Secondary | ICD-10-CM | POA: Diagnosis not present

## 2021-04-06 DIAGNOSIS — M109 Gout, unspecified: Secondary | ICD-10-CM | POA: Diagnosis not present

## 2021-04-06 DIAGNOSIS — R311 Benign essential microscopic hematuria: Secondary | ICD-10-CM | POA: Diagnosis not present

## 2021-04-06 DIAGNOSIS — I10 Essential (primary) hypertension: Secondary | ICD-10-CM | POA: Diagnosis not present

## 2021-04-06 DIAGNOSIS — R300 Dysuria: Secondary | ICD-10-CM | POA: Diagnosis not present

## 2021-04-20 DIAGNOSIS — R3129 Other microscopic hematuria: Secondary | ICD-10-CM | POA: Diagnosis not present

## 2021-04-20 DIAGNOSIS — N281 Cyst of kidney, acquired: Secondary | ICD-10-CM | POA: Diagnosis not present

## 2021-05-18 DIAGNOSIS — K5730 Diverticulosis of large intestine without perforation or abscess without bleeding: Secondary | ICD-10-CM | POA: Diagnosis not present

## 2021-05-18 DIAGNOSIS — Z800 Family history of malignant neoplasm of digestive organs: Secondary | ICD-10-CM | POA: Diagnosis not present

## 2021-05-18 DIAGNOSIS — K635 Polyp of colon: Secondary | ICD-10-CM | POA: Diagnosis not present

## 2021-05-18 DIAGNOSIS — K648 Other hemorrhoids: Secondary | ICD-10-CM | POA: Diagnosis not present

## 2021-05-20 DIAGNOSIS — K635 Polyp of colon: Secondary | ICD-10-CM | POA: Diagnosis not present

## 2021-06-10 ENCOUNTER — Other Ambulatory Visit: Payer: Medicare Other

## 2021-06-15 DIAGNOSIS — R3121 Asymptomatic microscopic hematuria: Secondary | ICD-10-CM | POA: Diagnosis not present

## 2021-06-16 ENCOUNTER — Other Ambulatory Visit: Payer: Medicare Other

## 2021-06-28 DIAGNOSIS — R319 Hematuria, unspecified: Secondary | ICD-10-CM | POA: Diagnosis not present

## 2021-06-28 DIAGNOSIS — R3121 Asymptomatic microscopic hematuria: Secondary | ICD-10-CM | POA: Diagnosis not present

## 2021-07-04 DIAGNOSIS — M109 Gout, unspecified: Secondary | ICD-10-CM | POA: Diagnosis not present

## 2021-07-04 DIAGNOSIS — R311 Benign essential microscopic hematuria: Secondary | ICD-10-CM | POA: Diagnosis not present

## 2021-07-04 DIAGNOSIS — I10 Essential (primary) hypertension: Secondary | ICD-10-CM | POA: Diagnosis not present

## 2021-07-14 ENCOUNTER — Other Ambulatory Visit: Payer: Self-pay | Admitting: Urology

## 2021-07-14 DIAGNOSIS — R3129 Other microscopic hematuria: Secondary | ICD-10-CM

## 2021-07-14 DIAGNOSIS — D376 Neoplasm of uncertain behavior of liver, gallbladder and bile ducts: Secondary | ICD-10-CM

## 2021-08-30 ENCOUNTER — Ambulatory Visit
Admission: RE | Admit: 2021-08-30 | Discharge: 2021-08-30 | Disposition: A | Discharge status: Home / Self Care | Payer: Medicare Other | Source: Ambulatory Visit | Attending: Urology | Admitting: Urology

## 2021-08-30 DIAGNOSIS — D376 Neoplasm of uncertain behavior of liver, gallbladder and bile ducts: Secondary | ICD-10-CM

## 2021-08-30 DIAGNOSIS — N281 Cyst of kidney, acquired: Secondary | ICD-10-CM | POA: Diagnosis not present

## 2021-08-30 DIAGNOSIS — K449 Diaphragmatic hernia without obstruction or gangrene: Secondary | ICD-10-CM | POA: Diagnosis not present

## 2021-08-30 DIAGNOSIS — K7689 Other specified diseases of liver: Secondary | ICD-10-CM | POA: Diagnosis not present

## 2021-08-30 DIAGNOSIS — K5730 Diverticulosis of large intestine without perforation or abscess without bleeding: Secondary | ICD-10-CM | POA: Diagnosis not present

## 2021-08-30 DIAGNOSIS — R3129 Other microscopic hematuria: Secondary | ICD-10-CM

## 2021-08-30 MED ORDER — GADOBENATE DIMEGLUMINE 529 MG/ML IV SOLN
13.0000 mL | Freq: Once | INTRAVENOUS | Status: AC | PRN
  Administered 2021-08-30: 13 mL via INTRAVENOUS

## 2021-09-06 ENCOUNTER — Telehealth: Payer: Self-pay

## 2021-09-06 NOTE — Patient Outreach (Signed)
  Care Coordination   09/06/2021 Name: Sandra Walton MRN: [id number] DOB: 04-Sep-1945   Care Coordination Outreach Attempts:  An unsuccessful telephone outreach was attempted today to offer the patient information about available care coordination services as a benefit of their health plan.   Follow Up Plan:  Additional outreach attempts will be made to offer the patient care coordination information and services.   Encounter Outcome:  No Answer  Care Coordination Interventions Activated:  No   Care Coordination Interventions:  No, not indicated    [place] Management [phone]

## 2021-09-23 ENCOUNTER — Telehealth: Payer: Self-pay

## 2021-09-23 NOTE — Patient Outreach (Signed)
  Care Coordination   Initial Visit Note   09/23/2021 Name: Sandra Walton MRN: [id number] DOB: [birth year]  Sandra Walton is a 76 y.o. year old female who sees Josetta Huddle, MD for primary care. I spoke with  Sandra Walton by phone today.  What matters to the patients health and wellness today?  No Concerns Expressed/Requested Call Back    Goals Addressed   None     SDOH assessments and interventions completed:  No     Care Coordination Interventions Activated:  No  Care Coordination Interventions:  No, not indicated   Follow up plan:  Will follow up next week.    Encounter Outcome:  Pt. Scheduled   Sandra Walton Care Management [phone]

## 2021-09-29 ENCOUNTER — Ambulatory Visit: Payer: Self-pay

## 2021-09-29 NOTE — Patient Outreach (Signed)
  Care Coordination   09/29/2021 Name: Sandra Walton MRN: [id number] DOB: 10-08-1945   Care Coordination Outreach Attempts:  An unsuccessful telephone outreach was attempted for a scheduled appointment today.  Follow Up Plan:  Additional outreach attempts will be made to offer the patient care coordination information and services.   Encounter Outcome:  No Answer  Care Coordination Interventions Activated:  No   Care Coordination Interventions:  No, not indicated    [facility] [phone]

## 2021-10-18 DIAGNOSIS — L309 Dermatitis, unspecified: Secondary | ICD-10-CM | POA: Diagnosis not present

## 2021-11-16 ENCOUNTER — Ambulatory Visit
Admission: RE | Admit: 2021-11-16 | Discharge: 2021-11-16 | Disposition: A | Discharge status: Home / Self Care | Payer: Medicare Other | Source: Ambulatory Visit | Attending: Internal Medicine | Admitting: Internal Medicine

## 2021-11-16 DIAGNOSIS — Z780 Asymptomatic menopausal state: Secondary | ICD-10-CM | POA: Diagnosis not present

## 2021-11-16 DIAGNOSIS — R3121 Asymptomatic microscopic hematuria: Secondary | ICD-10-CM | POA: Diagnosis not present

## 2021-11-16 DIAGNOSIS — M85852 Other specified disorders of bone density and structure, left thigh: Secondary | ICD-10-CM | POA: Diagnosis not present

## 2021-11-16 DIAGNOSIS — M810 Age-related osteoporosis without current pathological fracture: Secondary | ICD-10-CM

## 2021-11-16 DIAGNOSIS — N281 Cyst of kidney, acquired: Secondary | ICD-10-CM | POA: Diagnosis not present

## 2022-02-07 DIAGNOSIS — L309 Dermatitis, unspecified: Secondary | ICD-10-CM | POA: Diagnosis not present

## 2022-03-07 DIAGNOSIS — I10 Essential (primary) hypertension: Secondary | ICD-10-CM | POA: Diagnosis not present

## 2022-03-07 DIAGNOSIS — E559 Vitamin D deficiency, unspecified: Secondary | ICD-10-CM | POA: Diagnosis not present

## 2022-03-07 DIAGNOSIS — Z0000 Encounter for general adult medical examination without abnormal findings: Secondary | ICD-10-CM | POA: Diagnosis not present

## 2022-03-07 DIAGNOSIS — Z23 Encounter for immunization: Secondary | ICD-10-CM | POA: Diagnosis not present

## 2022-03-07 DIAGNOSIS — R311 Benign essential microscopic hematuria: Secondary | ICD-10-CM | POA: Diagnosis not present

## 2022-03-07 DIAGNOSIS — M109 Gout, unspecified: Secondary | ICD-10-CM | POA: Diagnosis not present

## 2022-03-07 DIAGNOSIS — K219 Gastro-esophageal reflux disease without esophagitis: Secondary | ICD-10-CM | POA: Diagnosis not present

## 2022-03-07 DIAGNOSIS — E782 Mixed hyperlipidemia: Secondary | ICD-10-CM | POA: Diagnosis not present

## 2022-03-07 DIAGNOSIS — M8580 Other specified disorders of bone density and structure, unspecified site: Secondary | ICD-10-CM | POA: Diagnosis not present

## 2022-09-06 DIAGNOSIS — I10 Essential (primary) hypertension: Secondary | ICD-10-CM | POA: Diagnosis not present

## 2022-09-06 DIAGNOSIS — E782 Mixed hyperlipidemia: Secondary | ICD-10-CM | POA: Diagnosis not present

## 2022-09-06 DIAGNOSIS — N1831 Chronic kidney disease, stage 3a: Secondary | ICD-10-CM | POA: Diagnosis not present

## 2022-10-11 DIAGNOSIS — Z23 Encounter for immunization: Secondary | ICD-10-CM | POA: Diagnosis not present

## 2022-11-08 DIAGNOSIS — N281 Cyst of kidney, acquired: Secondary | ICD-10-CM | POA: Diagnosis not present

## 2023-03-13 DIAGNOSIS — I10 Essential (primary) hypertension: Secondary | ICD-10-CM | POA: Diagnosis not present

## 2023-03-13 DIAGNOSIS — Z0000 Encounter for general adult medical examination without abnormal findings: Secondary | ICD-10-CM | POA: Diagnosis not present

## 2023-03-13 DIAGNOSIS — M109 Gout, unspecified: Secondary | ICD-10-CM | POA: Diagnosis not present

## 2023-03-13 DIAGNOSIS — K219 Gastro-esophageal reflux disease without esophagitis: Secondary | ICD-10-CM | POA: Diagnosis not present

## 2023-03-13 DIAGNOSIS — Z723 Lack of physical exercise: Secondary | ICD-10-CM | POA: Diagnosis not present

## 2023-03-13 DIAGNOSIS — Z79899 Other long term (current) drug therapy: Secondary | ICD-10-CM | POA: Diagnosis not present

## 2023-03-13 DIAGNOSIS — N1831 Chronic kidney disease, stage 3a: Secondary | ICD-10-CM | POA: Diagnosis not present

## 2023-03-13 DIAGNOSIS — R311 Benign essential microscopic hematuria: Secondary | ICD-10-CM | POA: Diagnosis not present

## 2023-03-13 DIAGNOSIS — E559 Vitamin D deficiency, unspecified: Secondary | ICD-10-CM | POA: Diagnosis not present

## 2023-03-13 DIAGNOSIS — M8580 Other specified disorders of bone density and structure, unspecified site: Secondary | ICD-10-CM | POA: Diagnosis not present

## 2023-03-13 DIAGNOSIS — E782 Mixed hyperlipidemia: Secondary | ICD-10-CM | POA: Diagnosis not present

## 2023-05-12 IMAGING — MG MM BREAST BX W/ LOC DEV 1ST LESION IMAGE BX SPEC STEREO GUIDE*R*
8 of 13 series · 8 of 21 positions shown · non-contrast
Comparison: Previous exams.
COMPARISON: Previous exams.

Addendum:
CLINICAL DATA: Patient presents for stereotactic core needle biopsy
right breast calcifications.

EXAM:
RIGHT BREAST STEREOTACTIC CORE NEEDLE BIOPSY

[R (1 of 8)]
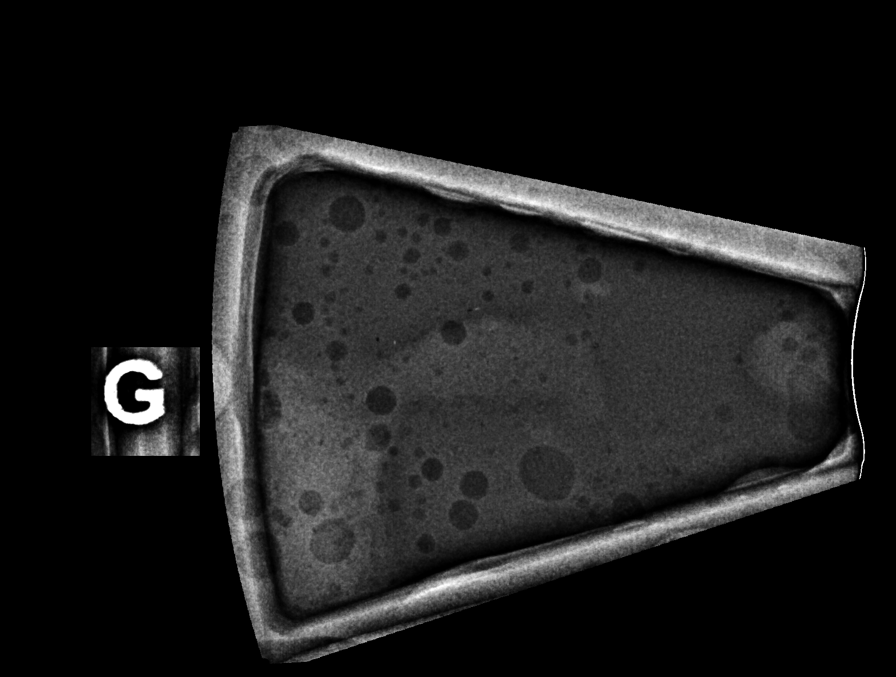

[R (2 of 8)]
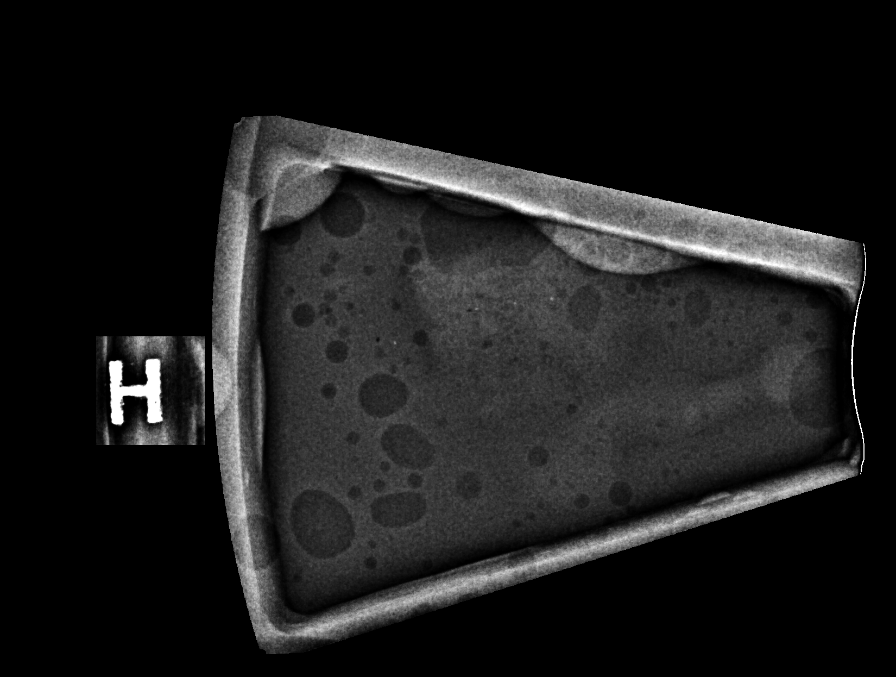

[R (3 of 8)]
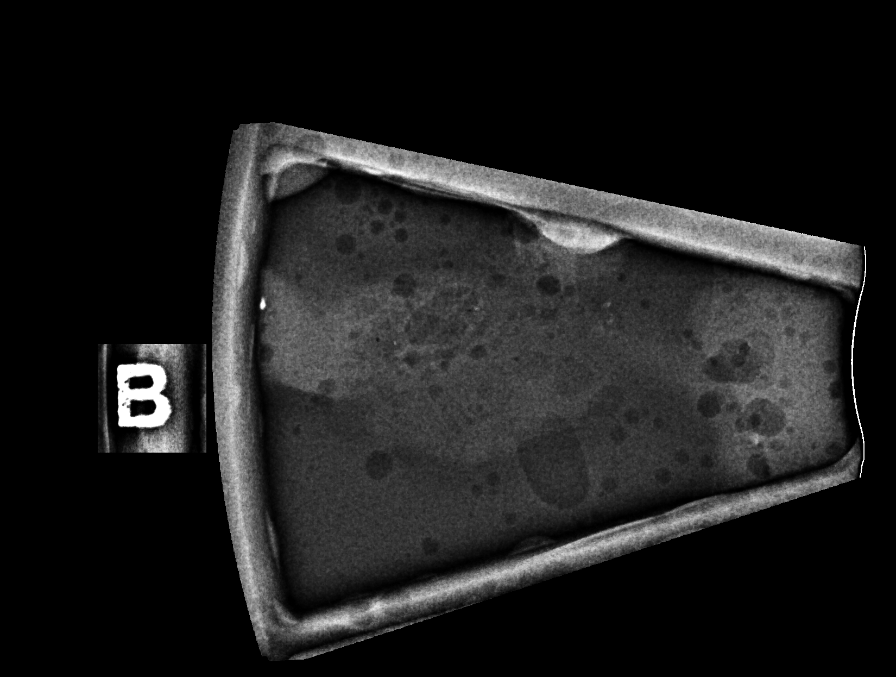

[R (4 of 8)]
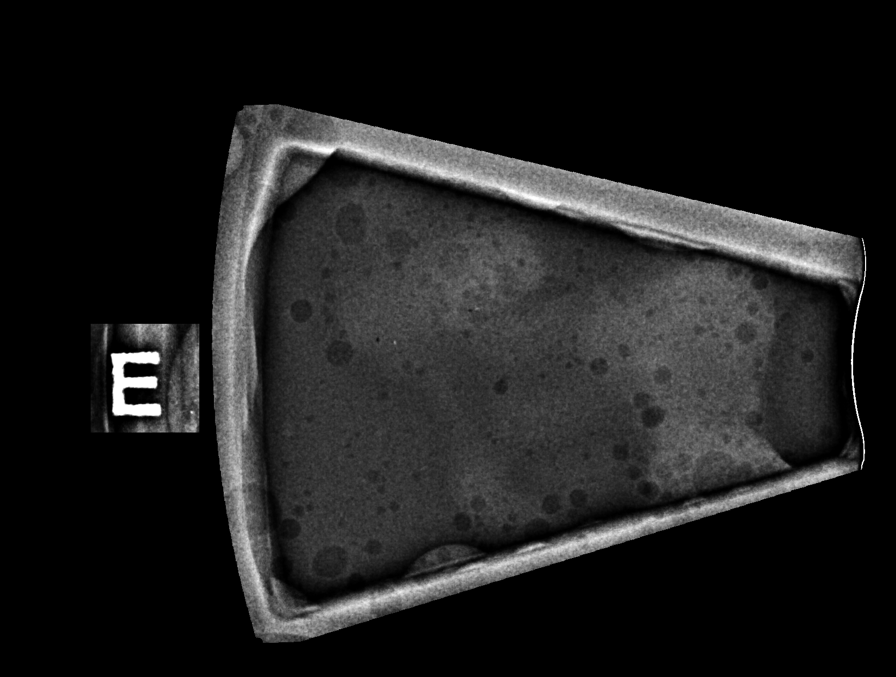

[R (5 of 8)]
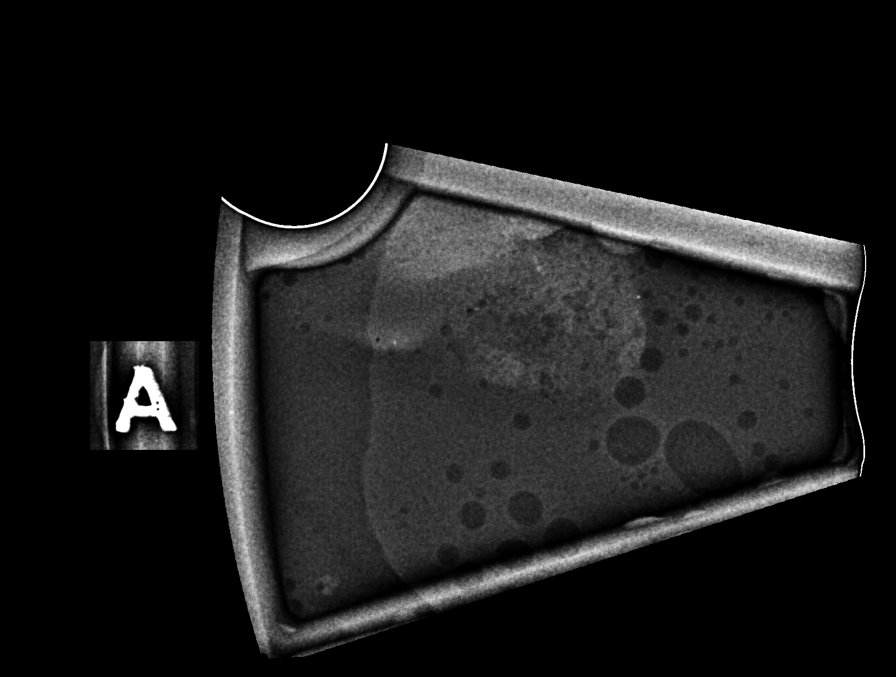

[R (6 of 8)]
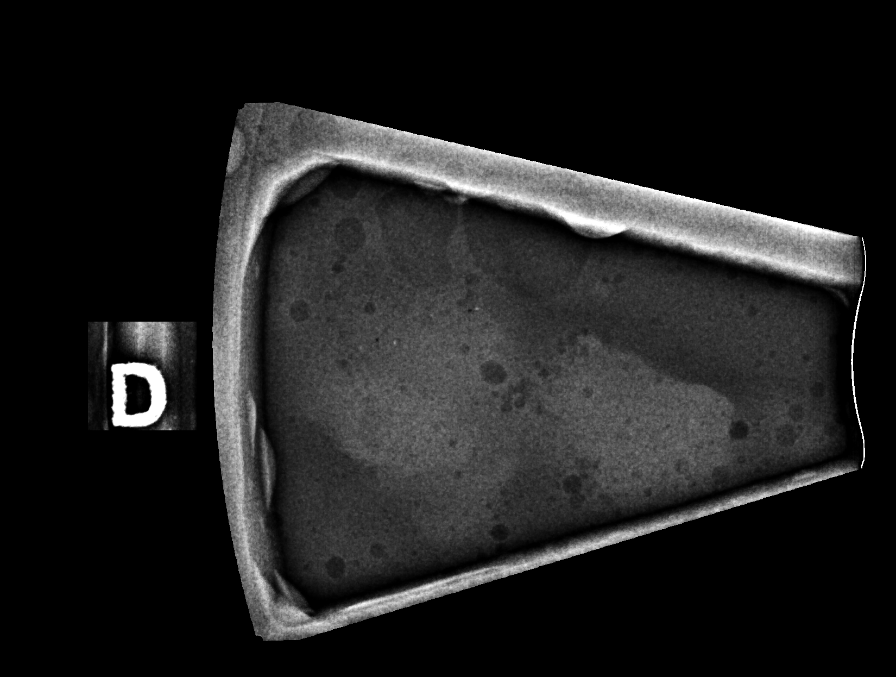

[R (7 of 8)]
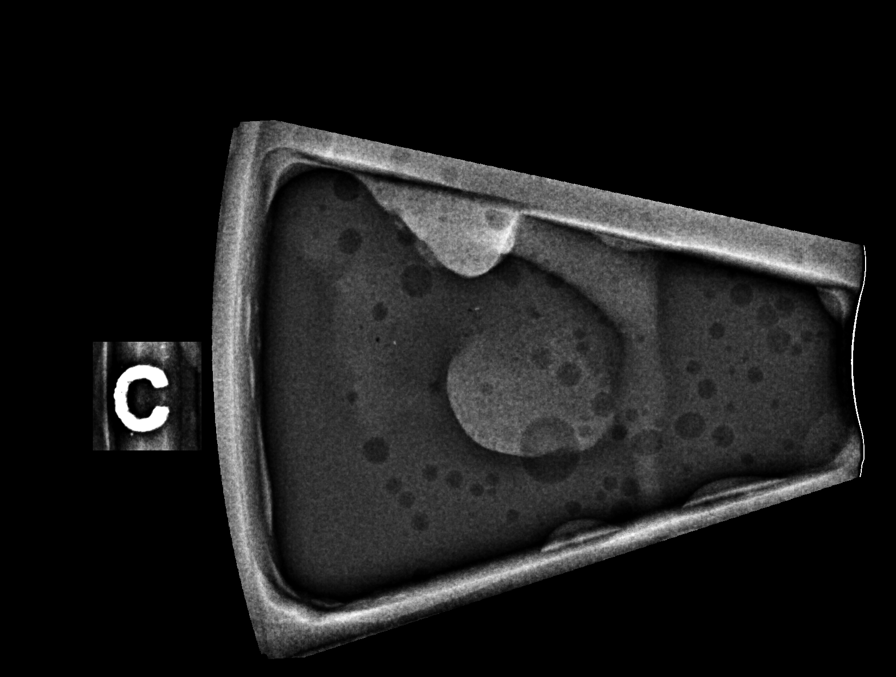

[R (8 of 8)]
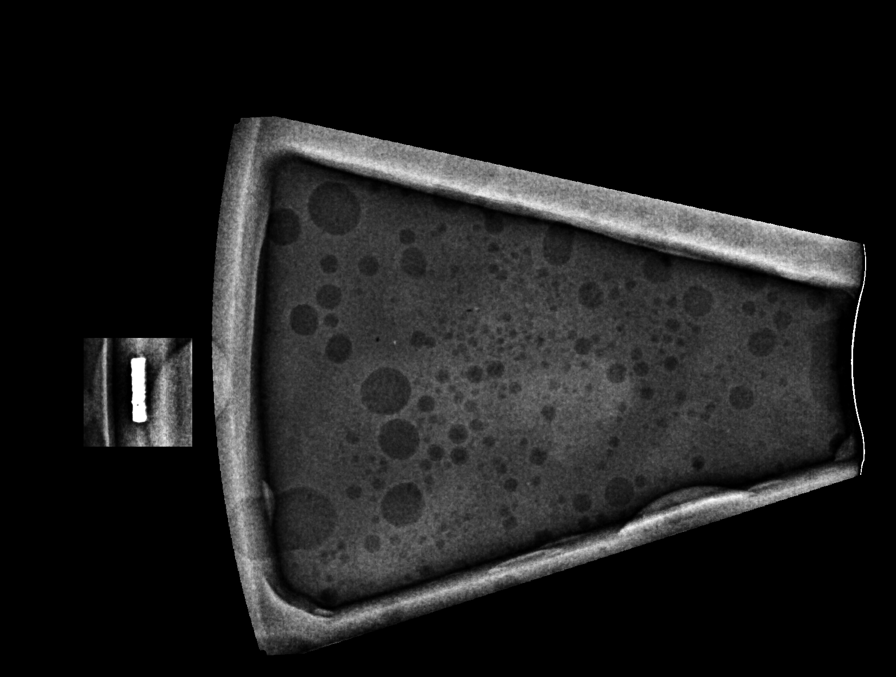

[8 of 21 positions shown; findings below may reference images not displayed]



Using sterile technique and 1% Lidocaine as local anesthetic, under
stereotactic guidance, a 9 gauge vacuum assisted device was used to
perform core needle biopsy of calcifications in the anterior, upper
outer quadrant of the right breast using a superior approach.
Specimen radiograph was performed showing several calcifications for
which biopsy was performed. Specimens with calcifications are
identified for pathology.

Lesion quadrant: Upper outer quadrant

At the conclusion of the procedure, a coil shaped tissue marker clip
was deployed into the biopsy cavity. Follow-up 2-view mammogram was
performed and dictated separately.
IMPRESSION: Stereotactic-guided biopsy of right breast calcifications. No
apparent complications.

ADDENDUM:
Pathology revealed BENIGN BREAST TISSUE WITH DYSTROPHIC
CALCIFICATIONS of the Right breast, anterior, upper outer quadrant,
(coil clip). This was found to be concordant by Dr. Nidia Nope.

Pathology results were discussed with the patient by telephone. The
patient reported doing well after the biopsy with tenderness at the
site. Post biopsy instructions and care were reviewed and questions
were answered. The patient was encouraged to call The [REDACTED] for any additional concerns. My direct phone
number was provided.

The patient was asked to return for Right diagnostic mammography and
ultrasound in 6 months and informed a reminder notice would be sent
regarding this appointment.

Pathology results reported by Ephphatha Nono, RN on 08/10/2020.



Using sterile technique and 1% Lidocaine as local anesthetic, under
stereotactic guidance, a 9 gauge vacuum assisted device was used to
perform core needle biopsy of calcifications in the anterior, upper
outer quadrant of the right breast using a superior approach.
Specimen radiograph was performed showing several calcifications for
which biopsy was performed. Specimens with calcifications are
identified for pathology.

Lesion quadrant: Upper outer quadrant

At the conclusion of the procedure, a coil shaped tissue marker clip
was deployed into the biopsy cavity. Follow-up 2-view mammogram was
performed and dictated separately.
IMPRESSION: Stereotactic-guided biopsy of right breast calcifications. No
apparent complications.

## 2023-05-12 IMAGING — MG MM BREAST LOCALIZATION CLIP
4 series · 4 of 12 positions shown · non-contrast
Comparison: Previous exam(s).

CLINICAL DATA: Evaluate post biopsy marker clip placement following
stereotactic core needle biopsy of right breast calcifications.

EXAM:
3D DIAGNOSTIC RIGHT MAMMOGRAM POST STEREOTACTIC BIOPSY

[R ML synth-2D]
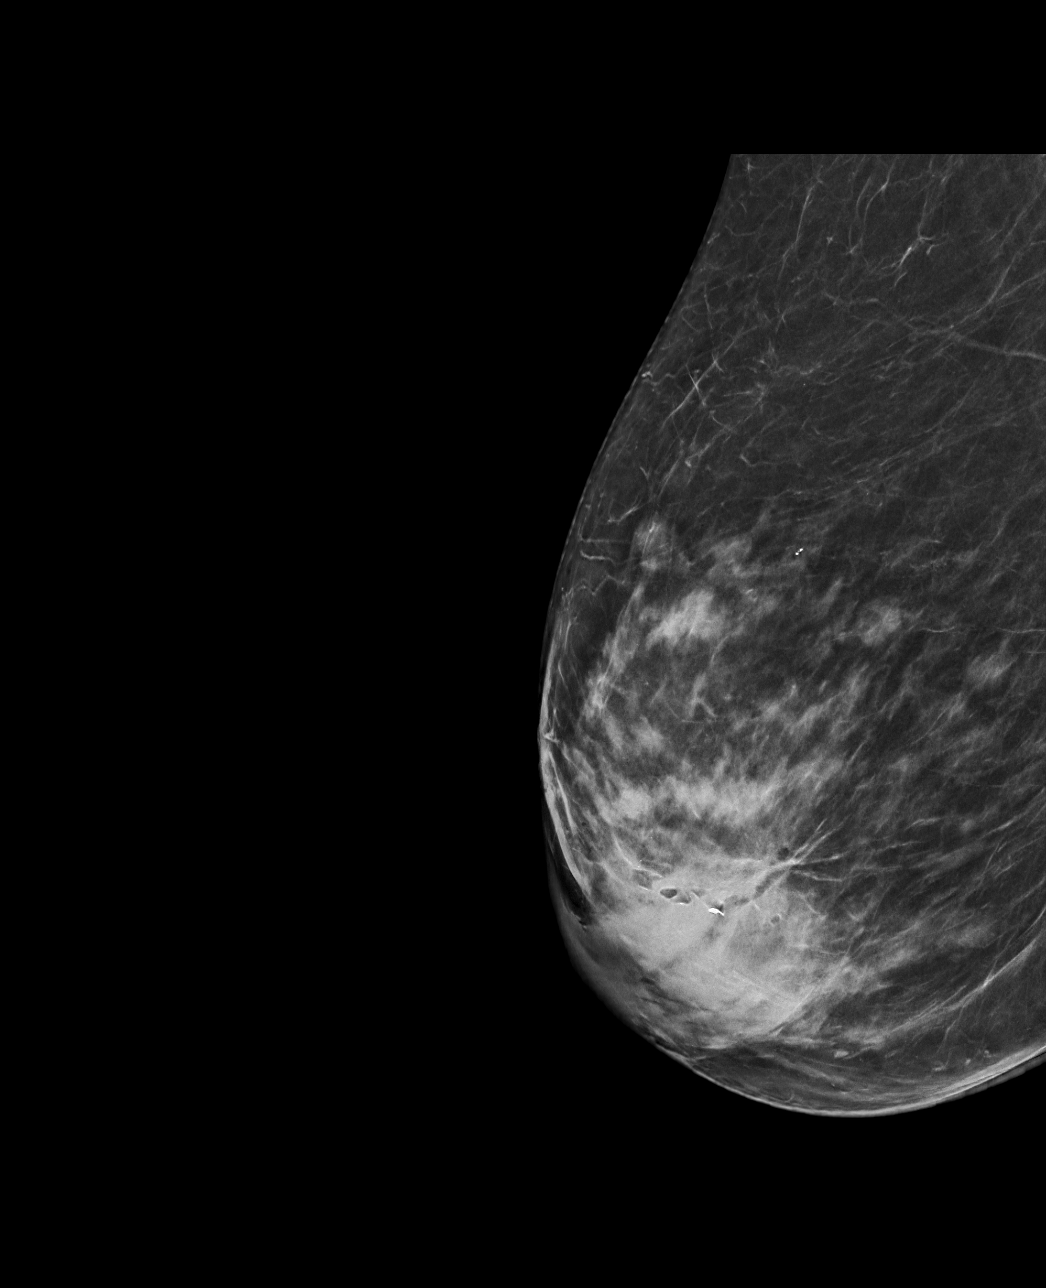

[R CC synth-2D]
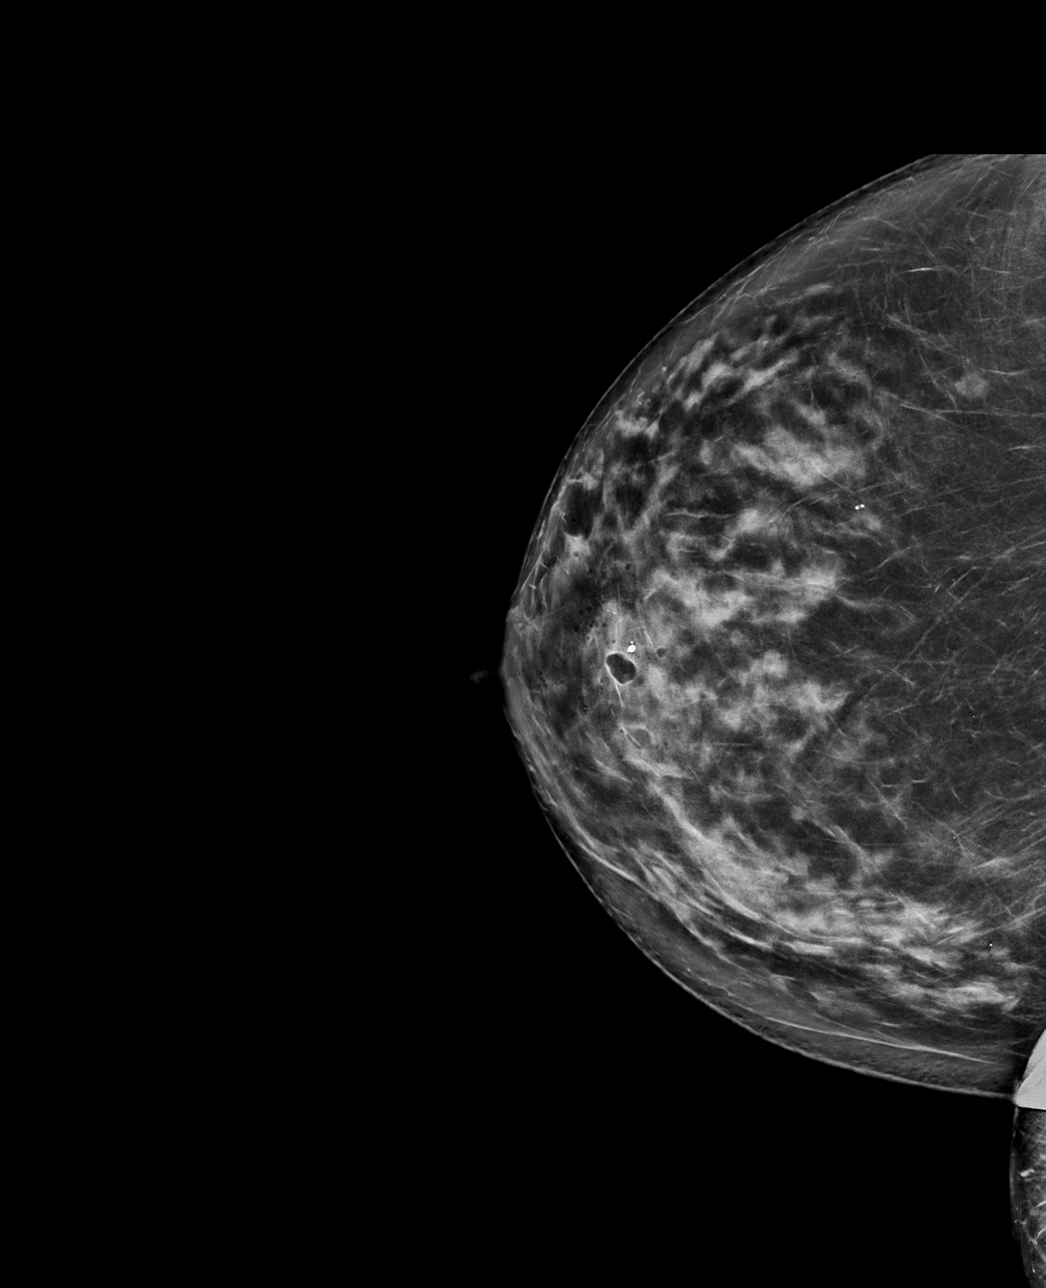

[R ML tomo · tomo slice 38/75.0]
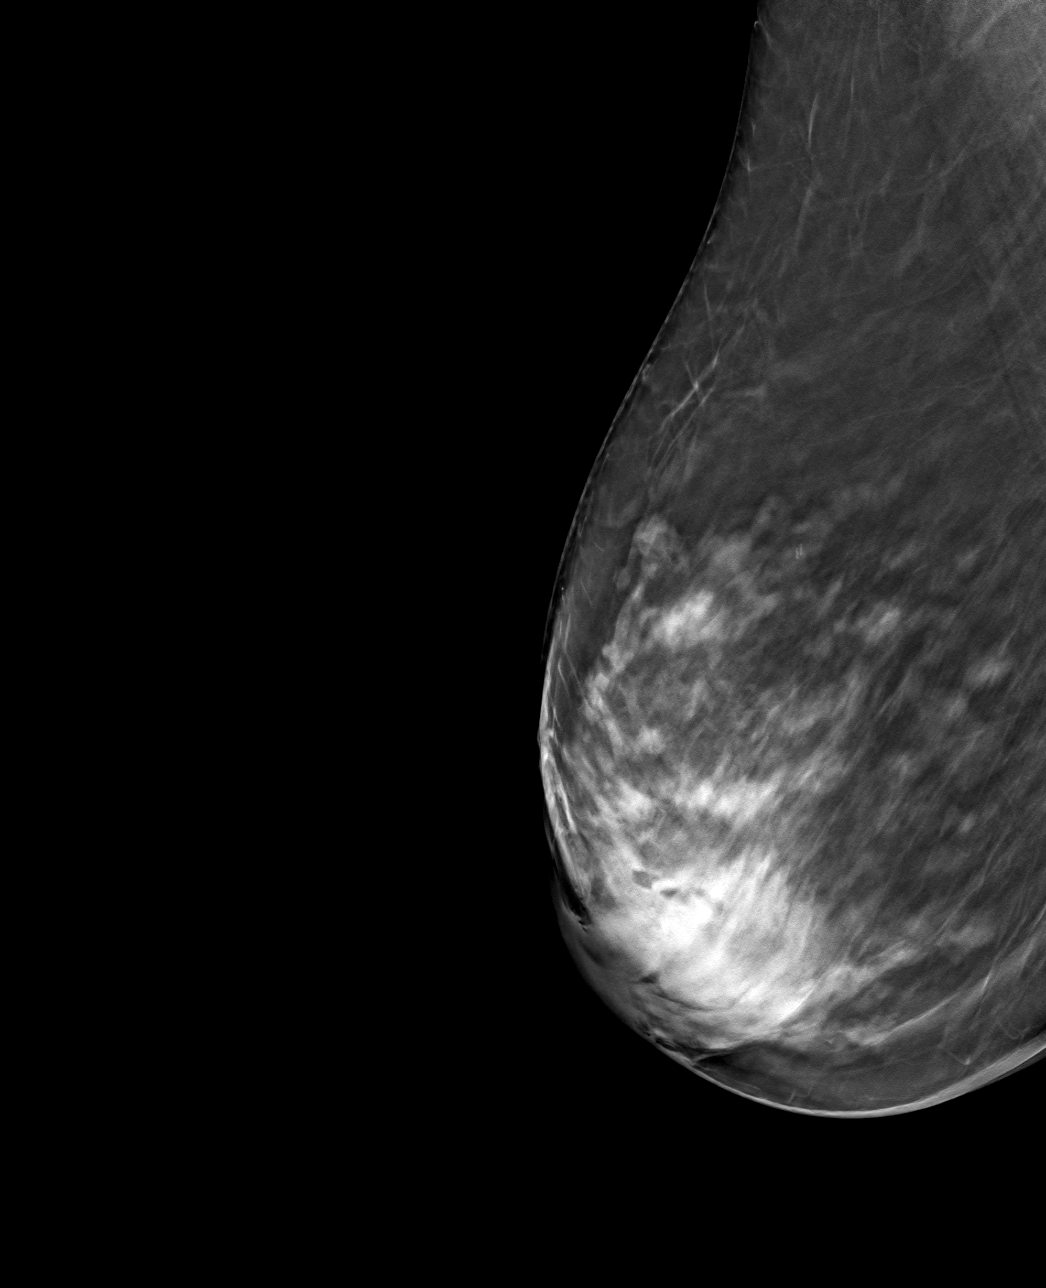

[R CC tomo · tomo slice 43/84.0]
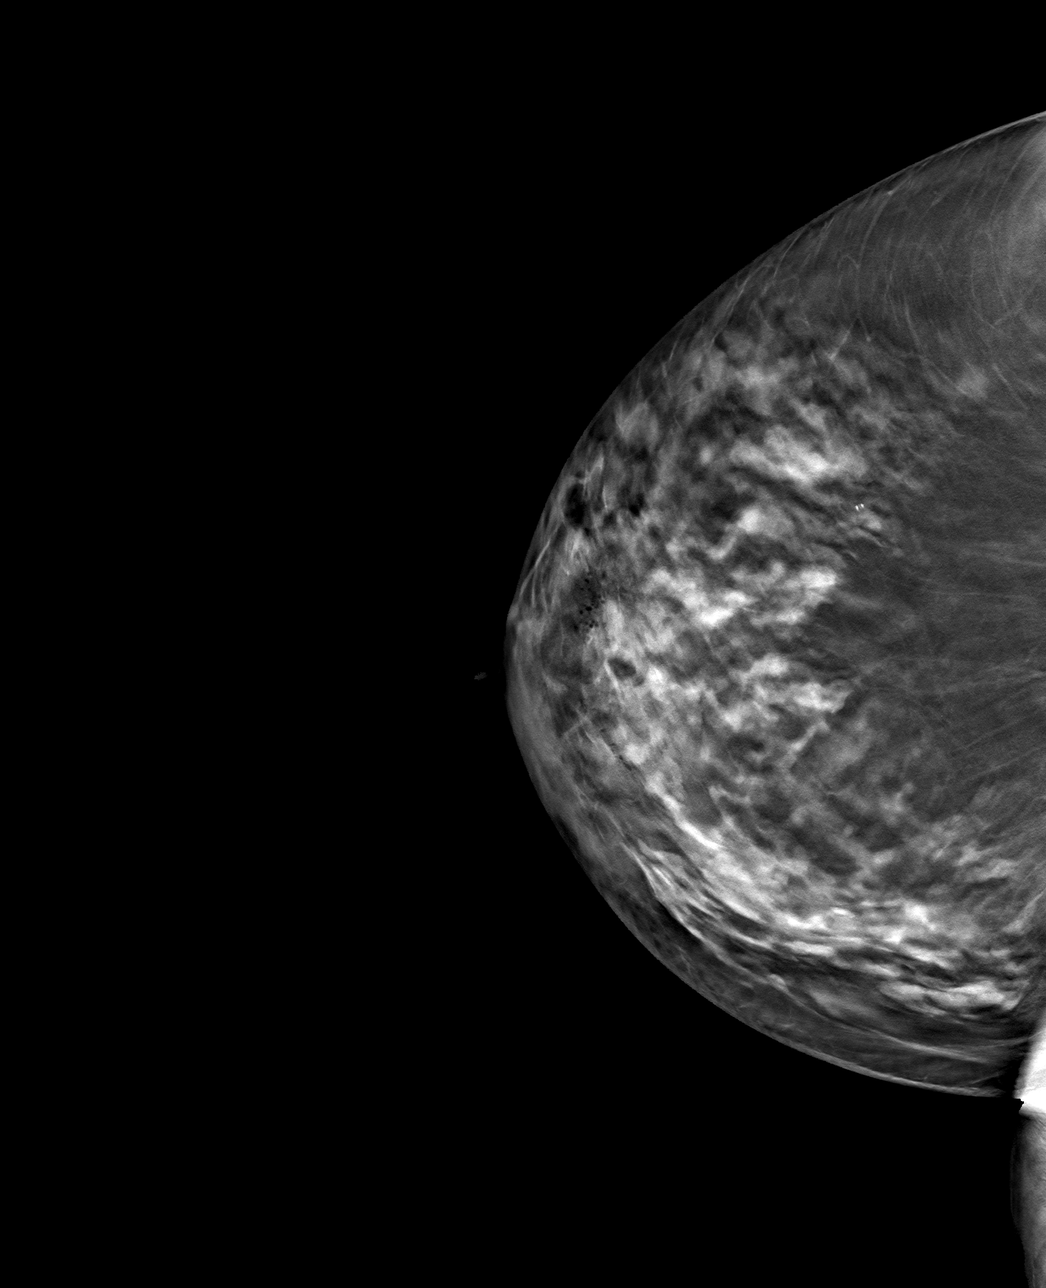

[4 of 12 positions shown; findings below may reference images not displayed]

FINDINGS: 3D Mammographic images were obtained following stereotactic guided
biopsy of right breast calcifications. The biopsy marking clip is in
expected position at the site of biopsy.
IMPRESSION: Appropriate positioning of the coil shaped biopsy marking clip at
the site of biopsy in the anterior, lateral aspect breast adjacent
to residual small round and punctate calcifications.

Final Assessment: Post Procedure Mammograms for Marker Placement

## 2023-06-24 ENCOUNTER — Ambulatory Visit (HOSPITAL_COMMUNITY): Admission: EM | Admit: 2023-06-24 | Discharge: 2023-06-24 | Disposition: A | Discharge status: Home / Self Care

## 2023-06-24 ENCOUNTER — Ambulatory Visit (INDEPENDENT_AMBULATORY_CARE_PROVIDER_SITE_OTHER)

## 2023-06-24 ENCOUNTER — Encounter (HOSPITAL_COMMUNITY): Payer: Self-pay

## 2023-06-24 DIAGNOSIS — R042 Hemoptysis: Secondary | ICD-10-CM | POA: Diagnosis not present

## 2023-06-24 DIAGNOSIS — R051 Acute cough: Secondary | ICD-10-CM

## 2023-06-24 DIAGNOSIS — J069 Acute upper respiratory infection, unspecified: Secondary | ICD-10-CM

## 2023-06-24 LAB — POC SARS CORONAVIRUS 2 AG -  ED: SARS Coronavirus 2 Ag: NEGATIVE

## 2023-06-24 MED ORDER — BENZONATATE 100 MG PO CAPS: 100.0000 mg | ORAL_CAPSULE | Freq: Three times a day (TID) | ORAL | 0 refills | Status: AC

## 2023-06-24 MED ORDER — AZELASTINE HCL 0.1 % NA SOLN: 2.0000 | Freq: Two times a day (BID) | NASAL | 0 refills | Status: AC

## 2023-06-24 NOTE — ED Provider Notes (Signed)
 [facility]    CSN: [id number] Arrival date & time: 06/24/23  1001      History   Chief Complaint Chief Complaint  Patient presents with   Nasal Congestion    HPI Sandra Walton is a 78 y.o. female.   Sick since 06/20/23. Boss came back sick from [place] this week and he got everyone sick. Cough, nasal congestion. Blowing nose, has bloody discharge. Coughing produces bloody sputum for a lot of blood - does not think it is from post nasal drainage. Denies headache, sore throat, sinus pressure. Took alka seltzer, didn't help her sx. Denies fever or chills. She did not test herself for covid at home.   HPI  Past Medical History:  Diagnosis Date   Asthma    Asthma -child   Cataracts, bilateral    not surgical yet   Headache    past hx migraines   Hypertension     There are no active problems to display for this patient.   Past Surgical History:  Procedure Laterality Date   BREAST SURGERY Right    lumpectomy- benign   COLONOSCOPY WITH PROPOFOL  N/A 05/18/2015   Procedure: COLONOSCOPY WITH PROPOFOL ;  Surgeon: Gladis MARLA Louder, MD;  Location: [facility];  Service: Endoscopy;  Laterality: N/A;    OB History   No obstetric history on file.      Home Medications    Prior to Admission medications   Medication Sig Start Date End Date Taking? Authorizing Provider  allopurinol (ZYLOPRIM) 100 MG tablet Take 200 mg by mouth daily. 05/23/23  Yes [provider]  azelastine (ASTELIN) 0.1 % nasal spray Place 2 sprays into both nostrils 2 (two) times daily. Use in each nostril as directed 06/24/23  Yes Jakavion Bilodeau, Jon HERO, NP  benzonatate (TESSALON) 100 MG capsule Take 1 capsule (100 mg total) by mouth every 8 (eight) hours. 06/24/23  Yes Richad Jon HERO, NP  fexofenadine (ALLEGRA) 180 MG tablet Take 1 tablet by mouth daily as needed.    [provider]  nebivolol (BYSTOLIC) 10 MG tablet Take 5 mg by mouth daily.    [provider]   simvastatin (ZOCOR) 20 MG tablet Take 20 mg by mouth daily.    [provider]  valsartan-hydrochlorothiazide (DIOVAN-HCT) 160-12.5 MG tablet Take 0.5 tablets by mouth daily.    [provider]    Family History Family History  Problem Relation Age of Onset   Breast cancer Sister    Cancer Sister    Heart disease Sister    Cancer Brother     Social History Social History   Tobacco Use   Smoking status: Never   Smokeless tobacco: Never  Substance Use Topics   Alcohol use: Yes    Comment: rare social   Drug use: No     Allergies   Bee venom and Peanut-containing drug products   Review of Systems Review of Systems   Physical Exam Triage Vital Signs ED Triage Vitals  Encounter Vitals Group     BP 06/24/23 1016 96/64     Girls Systolic BP Percentile --      Girls Diastolic BP Percentile --      Boys Systolic BP Percentile --      Boys Diastolic BP Percentile --      Pulse Rate 06/24/23 1016 92     Resp 06/24/23 1016 16     Temp 06/24/23 1016 99.4 F (37.4 C)     Temp Source 06/24/23 1016  Oral     SpO2 06/24/23 1016 96 %     Weight --      Height --      Head Circumference --      Peak Flow --      Pain Score 06/24/23 1014 0     Pain Loc --      Pain Education --      Exclude from Growth Chart --    No data found.  Updated Vital Signs BP 93/64   Pulse 89   Temp 99.2 F (37.3 C)   Resp 20   SpO2 96%   Visual Acuity Right Eye Distance:   Left Eye Distance:   Bilateral Distance:    Right Eye Near:   Left Eye Near:    Bilateral Near:     Physical Exam Constitutional:      General: She is not in acute distress.    Appearance: Normal appearance. She is not ill-appearing.  HENT:     Right Ear: Ear canal and external ear normal.     Left Ear: Tympanic membrane, ear canal and external ear normal.     Ears:     Comments: R TM looks like it has old rupture in superior aspect    Nose: Congestion present.     Right Nostril: No  epistaxis.     Left Nostril: No epistaxis.     Right Turbinates: Swollen.     Left Turbinates: Swollen.     Right Sinus: No maxillary sinus tenderness or frontal sinus tenderness.     Left Sinus: No maxillary sinus tenderness or frontal sinus tenderness.     Comments: No bleeding or friable area seen in nasal mucosa  Cardiovascular:     Rate and Rhythm: Normal rate and regular rhythm.  Pulmonary:     Effort: Pulmonary effort is normal.     Breath sounds: Normal breath sounds.     Comments: No cough observed  Neurological:     Mental Status: She is alert.      UC Treatments / Results  Labs (all labs ordered are listed, but only abnormal results are displayed) Labs Reviewed  POC SARS CORONAVIRUS 2 AG -  ED    EKG   Radiology DG Chest 2 View Result Date: 06/24/2023 CLINICAL DATA:  Hemoptysis EXAM: CHEST - 2 VIEW COMPARISON:  Chest radiograph dated 08/04/2014 FINDINGS: Normal lung volumes. No focal consolidations. No pleural effusion or pneumothorax. Similar mildly enlarged cardiomediastinal silhouette. No acute osseous abnormality. IMPRESSION: 1. No active cardiopulmonary disease. 2. Similar mild cardiomegaly. Electronically Signed   By: Limin  Xu M.D.   On: 06/24/2023 11:18    Procedures Procedures (including critical care time)  Medications Ordered in UC Medications - No data to display  Initial Impression / Assessment and Plan / UC Course  I have reviewed the triage vital signs and the nursing notes.  Pertinent labs & imaging results that were available during my care of the patient were reviewed by me and considered in my medical decision making (see chart for details).  Clinical Course as of 06/24/23 1149  Sun Jun 24, 2023  1134 DG Chest 2 View [AK]    Clinical Course User Index [AK] Richad Jon HERO, NP    CXR unremarkable for c/o coughing up blood. I suspect blood in sputum is from nasal mucosa bleeding and is draining down her throat. Rec she f/u with PCP if  it continues. COVID negative. Likely other viral URI. Discussed supportive  care.    Final Clinical Impressions(s) / UC Diagnoses   Final diagnoses:  Acute cough  Upper respiratory tract infection, unspecified type     Discharge Instructions      COVID test was negative and chest x-ray did not show pneumonia or other new problem.   This is most likely a viral upper respiratory infection. I have prescribed a nasal spray and cough medicine to help you manage the symptoms.   If you continue to cough up blood/blow blood out of your nose even when you are no longer sick, see your regular primary care provider. You might need to see a specialist like an ENT or GI specialist.      ED Prescriptions     Medication Sig Dispense Auth. Provider   azelastine (ASTELIN) 0.1 % nasal spray Place 2 sprays into both nostrils 2 (two) times daily. Use in each nostril as directed 30 mL Richad Jon HERO, NP   benzonatate (TESSALON) 100 MG capsule Take 1 capsule (100 mg total) by mouth every 8 (eight) hours. 21 capsule Richad Jon HERO, NP      PDMP not reviewed this encounter.   Richad Jon HERO, NP 06/24/23 1150

## 2023-06-24 NOTE — Discharge Instructions (Addendum)
 COVID test was negative and chest x-ray did not show pneumonia or other new problem.   This is most likely a viral upper respiratory infection. I have prescribed a nasal spray and cough medicine to help you manage the symptoms.   If you continue to cough up blood/blow blood out of your nose even when you are no longer sick, see your regular primary care provider. You might need to see a specialist like an ENT or GI specialist.

## 2023-06-24 NOTE — ED Triage Notes (Signed)
 Patient here today with c/o nasal congestion and cough since Wednesday. She tried taking Alka Seltzer with no relief. Patient states that she has been coughing up blood.

## 2023-06-27 DIAGNOSIS — H938X2 Other specified disorders of left ear: Secondary | ICD-10-CM | POA: Diagnosis not present

## 2023-07-03 DIAGNOSIS — J329 Chronic sinusitis, unspecified: Secondary | ICD-10-CM | POA: Diagnosis not present

## 2023-09-11 DIAGNOSIS — N1831 Chronic kidney disease, stage 3a: Secondary | ICD-10-CM | POA: Diagnosis not present

## 2023-09-11 DIAGNOSIS — L309 Dermatitis, unspecified: Secondary | ICD-10-CM | POA: Diagnosis not present

## 2023-09-11 DIAGNOSIS — I10 Essential (primary) hypertension: Secondary | ICD-10-CM | POA: Diagnosis not present

## 2023-10-30 DIAGNOSIS — L850 Acquired ichthyosis: Secondary | ICD-10-CM | POA: Diagnosis not present
